# Patient Record
Sex: Female | Born: 1985 | Race: Black or African American | Hispanic: No | Marital: Single | Smoking: Never smoker
Health system: Southern US, Community
[De-identification: ages and names within clinical notes are randomized; demographics above are authoritative.]

## PROBLEM LIST (undated history)

## (undated) ENCOUNTER — Inpatient Hospital Stay (HOSPITAL_COMMUNITY): Payer: Self-pay

## (undated) DIAGNOSIS — E282 Polycystic ovarian syndrome: Secondary | ICD-10-CM

## (undated) DIAGNOSIS — R3915 Urgency of urination: Secondary | ICD-10-CM

## (undated) DIAGNOSIS — Z973 Presence of spectacles and contact lenses: Secondary | ICD-10-CM

## (undated) DIAGNOSIS — Z87442 Personal history of urinary calculi: Secondary | ICD-10-CM

## (undated) DIAGNOSIS — L0291 Cutaneous abscess, unspecified: Secondary | ICD-10-CM

## (undated) DIAGNOSIS — N200 Calculus of kidney: Secondary | ICD-10-CM

## (undated) HISTORY — PX: EXTRACORPOREAL SHOCK WAVE LITHOTRIPSY: SHX1557

## (undated) HISTORY — PX: BUNIONECTOMY: SHX129

---

## 2010-11-29 HISTORY — PX: WISDOM TOOTH EXTRACTION: SHX21

## 2014-04-10 ENCOUNTER — Emergency Department (HOSPITAL_BASED_OUTPATIENT_CLINIC_OR_DEPARTMENT_OTHER)
Admission: EM | Admit: 2014-04-10 | Discharge: 2014-04-11 | Disposition: A | Discharge status: Home / Self Care | Payer: No Typology Code available for payment source | Attending: Emergency Medicine | Admitting: Emergency Medicine

## 2014-04-10 ENCOUNTER — Encounter (HOSPITAL_BASED_OUTPATIENT_CLINIC_OR_DEPARTMENT_OTHER): Payer: Self-pay | Admitting: Emergency Medicine

## 2014-04-10 DIAGNOSIS — R1032 Left lower quadrant pain: Secondary | ICD-10-CM | POA: Insufficient documentation

## 2014-04-10 DIAGNOSIS — Z3202 Encounter for pregnancy test, result negative: Secondary | ICD-10-CM | POA: Insufficient documentation

## 2014-04-10 LAB — URINALYSIS, ROUTINE W REFLEX MICROSCOPIC
Bilirubin Urine: NEGATIVE
Glucose, UA: NEGATIVE mg/dL
Hgb urine dipstick: NEGATIVE
Ketones, ur: NEGATIVE mg/dL
Nitrite: NEGATIVE
Protein, ur: NEGATIVE mg/dL
Specific Gravity, Urine: 1.024 (ref 1.005–1.030)
UROBILINOGEN UA: 0.2 mg/dL (ref 0.0–1.0)
pH: 6 (ref 5.0–8.0)

## 2014-04-10 LAB — URINE MICROSCOPIC-ADD ON

## 2014-04-10 LAB — PREGNANCY, URINE: PREG TEST UR: NEGATIVE

## 2014-04-10 NOTE — ED Notes (Signed)
Pt was given a gown and asked to take everything off but her underware.  When I went back with warm blankets she still had not made any attempts to change into her gown. She said "I'm making progress."

## 2014-04-10 NOTE — ED Notes (Signed)
Pt states that she has had irregular periods in the past and was on birth control, but that was stopped 2 years ago and periods have been regular since then. However she did stated that she does take a birth control pill if she does not have a period for 2 months, but she is unaware of the name of pill.

## 2014-04-10 NOTE — ED Notes (Signed)
Pt reports 2 1/2 week history of abdominal cramps associated with nausea, no emesis, states that she does not have menstraul cramping so she knows it is not related to her period, pt stated that she could not take the pain any longer

## 2014-04-11 ENCOUNTER — Emergency Department (HOSPITAL_BASED_OUTPATIENT_CLINIC_OR_DEPARTMENT_OTHER): Payer: No Typology Code available for payment source

## 2014-04-11 LAB — CBC WITH DIFFERENTIAL/PLATELET
BASOS ABS: 0 10*3/uL (ref 0.0–0.1)
BASOS PCT: 0 % (ref 0–1)
EOS PCT: 1 % (ref 0–5)
Eosinophils Absolute: 0.1 10*3/uL (ref 0.0–0.7)
HEMATOCRIT: 39.5 % (ref 36.0–46.0)
HEMOGLOBIN: 12.8 g/dL (ref 12.0–15.0)
Lymphocytes Relative: 31 % (ref 12–46)
Lymphs Abs: 2.1 10*3/uL (ref 0.7–4.0)
MCH: 31 pg (ref 26.0–34.0)
MCHC: 32.4 g/dL (ref 30.0–36.0)
MCV: 95.6 fL (ref 78.0–100.0)
MONOS PCT: 8 % (ref 3–12)
Monocytes Absolute: 0.5 10*3/uL (ref 0.1–1.0)
Neutro Abs: 4.1 10*3/uL (ref 1.7–7.7)
Neutrophils Relative %: 60 % (ref 43–77)
Platelets: 222 10*3/uL (ref 150–400)
RBC: 4.13 MIL/uL (ref 3.87–5.11)
RDW: 12.3 % (ref 11.5–15.5)
WBC: 6.7 10*3/uL (ref 4.0–10.5)

## 2014-04-11 LAB — BASIC METABOLIC PANEL
BUN: 8 mg/dL (ref 6–23)
CALCIUM: 9.9 mg/dL (ref 8.4–10.5)
CO2: 27 mEq/L (ref 19–32)
Chloride: 104 mEq/L (ref 96–112)
Creatinine, Ser: 0.8 mg/dL (ref 0.50–1.10)
GFR calc non Af Amer: >90 mL/min (ref >90)
Glucose, Bld: 96 mg/dL (ref 70–99)
Potassium: 4 mEq/L (ref 3.7–5.3)
Sodium: 142 mEq/L (ref 137–147)

## 2014-04-11 LAB — HIV ANTIBODY (ROUTINE TESTING W REFLEX): HIV: NONREACTIVE

## 2014-04-11 LAB — WET PREP, GENITAL
Trich, Wet Prep: NONE SEEN
Yeast Wet Prep HPF POC: NONE SEEN

## 2014-04-11 LAB — LIPASE, BLOOD: LIPASE: 43 U/L (ref 11–59)

## 2014-04-11 LAB — GC/CHLAMYDIA PROBE AMP
CT Probe RNA: NEGATIVE
GC Probe RNA: NEGATIVE

## 2014-04-11 MED ORDER — SODIUM CHLORIDE 0.9 % IV SOLN
INTRAVENOUS | Status: DC
  Administered 2014-04-11: 01:00:00 via INTRAVENOUS

## 2014-04-11 MED ORDER — IOHEXOL 300 MG/ML  SOLN
50.0000 mL | Freq: Once | INTRAMUSCULAR | Status: AC | PRN
  Administered 2014-04-11: 50 mL via ORAL

## 2014-04-11 MED ORDER — IOHEXOL 300 MG/ML  SOLN
100.0000 mL | Freq: Once | INTRAMUSCULAR | Status: AC | PRN
  Administered 2014-04-11: 100 mL via INTRAVENOUS

## 2014-04-11 NOTE — ED Notes (Signed)
MD at bedside. 

## 2014-04-11 NOTE — ED Provider Notes (Signed)
CSN: [id number]     Arrival date & time 04/10/14  2308 History   First MD Initiated Contact with Patient 04/11/14 0024     Chief Complaint  Patient presents with  . Abdominal Cramping     (Consider location/radiation/quality/duration/timing/severity/associated sxs/prior Treatment) HPI This is a 28 year old female with a 2 and half week history of abdominal pain. The pain is located in the left lower quadrant it radiates to the suprapubic and left upper quadrant regions. The pain is described as crampy. It is "real bad" at its worst. It has not been associated with fever, chills, nausea, vomiting, diarrhea, constipation, vaginal bleeding, vaginal discharge, dysuria or hematuria. It is worse with movement or palpation.  History reviewed. No pertinent past medical history. History reviewed. No pertinent past surgical history. History reviewed. No pertinent family history. History  Substance Use Topics  . Smoking status: Never Smoker   . Smokeless tobacco: Not on file  . Alcohol Use: Yes     Comment: occassionally   OB History   Grav Para Term Preterm Abortions TAB SAB Ect Mult Living                 Review of Systems  All other systems reviewed and are negative.   Allergies  Review of patient's allergies indicates no known allergies.  Home Medications   Prior to Admission medications   Medication Sig Start Date End Date Taking? Authorizing Provider  medroxyPROGESTERone (PROVERA) 10 MG tablet Take 10 mg by mouth daily. She only takes if she has had no period in 2 months, last dose was in october   Yes Historical Provider, MD   BP 120/61  Pulse 60  Temp(Src) 98.1 F (36.7 C) (Oral)  Resp 20  Ht 5\' 7"  (1.702 m)  Wt 245 lb (111.131 kg)  BMI 38.36 kg/m2  SpO2 98%  LMP 03/11/2014  Physical Exam General: Well-developed, well-nourished female in no acute distress; appearance consistent with age of record HENT: normocephalic; atraumatic Eyes: pupils equal, round and  reactive to light; extraocular muscles intact Neck: supple Heart: regular rate and rhythm Lungs: clear to auscultation bilaterally Abdomen: soft; nondistended; left lower quadrant tenderness with mild suprapubic and left upper quadrant tenderness; no masses or hepatosplenomegaly; bowel sounds present GU: No CVA tenderness; normal external genitalia; physiologic appearing vaginal discharge; no vaginal bleeding; no cervical motion tenderness; equivocal left adnexal tenderness Extremities: No deformity; full range of motion; pulses normal Neurologic: Awake, alert and oriented; motor function intact in all extremities and symmetric; no facial droop Skin: Warm and dry Psychiatric: Normal mood and affect    ED Course  Procedures (including critical care time)    MDM   Nursing notes and vitals signs, including pulse oximetry, reviewed.  Summary of this visit's results, reviewed by myself:  Labs:  Results for orders placed during the hospital encounter of 04/10/14 (from the past 24 hour(s))  URINALYSIS, ROUTINE W REFLEX MICROSCOPIC     Status: Abnormal   Collection Time    04/10/14 11:21 PM      Result Value Ref Range   Color, Urine YELLOW  YELLOW   APPearance CLOUDY (*) CLEAR   Specific Gravity, Urine 1.024  1.005 - 1.030   pH 6.0  5.0 - 8.0   Glucose, UA NEGATIVE  NEGATIVE mg/dL   Hgb urine dipstick NEGATIVE  NEGATIVE   Bilirubin Urine NEGATIVE  NEGATIVE   Ketones, ur NEGATIVE  NEGATIVE mg/dL   Protein, ur NEGATIVE  NEGATIVE mg/dL   Urobilinogen, UA  0.2  0.0 - 1.0 mg/dL   Nitrite NEGATIVE  NEGATIVE   Leukocytes, UA SMALL (*) NEGATIVE  PREGNANCY, URINE     Status: None   Collection Time    04/10/14 11:21 PM      Result Value Ref Range   Preg Test, Ur NEGATIVE  NEGATIVE  URINE MICROSCOPIC-ADD ON     Status: Abnormal   Collection Time    04/10/14 11:21 PM      Result Value Ref Range   Squamous Epithelial / LPF MANY (*) RARE   WBC, UA 3-6  <3 WBC/hpf   RBC / HPF 0-2  <3  RBC/hpf   Bacteria, UA FEW (*) RARE  WET PREP, GENITAL     Status: Abnormal   Collection Time    04/11/14 12:45 AM      Result Value Ref Range   Yeast Wet Prep HPF POC NONE SEEN  NONE SEEN   Trich, Wet Prep NONE SEEN  NONE SEEN   Clue Cells Wet Prep HPF POC FEW (*) NONE SEEN   WBC, Wet Prep HPF POC FEW (*) NONE SEEN  CBC WITH DIFFERENTIAL     Status: None   Collection Time    04/11/14  1:05 AM      Result Value Ref Range   WBC 6.7  4.0 - 10.5 K/uL   RBC 4.13  3.87 - 5.11 MIL/uL   Hemoglobin 12.8  12.0 - 15.0 g/dL   HCT 16.139.5  09.636.0 - 04.546.0 %   MCV 95.6  78.0 - 100.0 fL   MCH 31.0  26.0 - 34.0 pg   MCHC 32.4  30.0 - 36.0 g/dL   RDW 40.912.3  81.111.5 - 91.415.5 %   Platelets 222  150 - 400 K/uL   Neutrophils Relative % 60  43 - 77 %   Neutro Abs 4.1  1.7 - 7.7 K/uL   Lymphocytes Relative 31  12 - 46 %   Lymphs Abs 2.1  0.7 - 4.0 K/uL   Monocytes Relative 8  3 - 12 %   Monocytes Absolute 0.5  0.1 - 1.0 K/uL   Eosinophils Relative 1  0 - 5 %   Eosinophils Absolute 0.1  0.0 - 0.7 K/uL   Basophils Relative 0  0 - 1 %   Basophils Absolute 0.0  0.0 - 0.1 K/uL  BASIC METABOLIC PANEL     Status: None   Collection Time    04/11/14  1:05 AM      Result Value Ref Range   Sodium 142  137 - 147 mEq/L   Potassium 4.0  3.7 - 5.3 mEq/L   Chloride 104  96 - 112 mEq/L   CO2 27  19 - 32 mEq/L   Glucose, Bld 96  70 - 99 mg/dL   BUN 8  6 - 23 mg/dL   Creatinine, Ser 7.820.80  0.50 - 1.10 mg/dL   Calcium 9.9  8.4 - 95.610.5 mg/dL   GFR calc non Af Amer >90  >90 mL/min   GFR calc Af Amer >90  >90 mL/min  LIPASE, BLOOD     Status: None   Collection Time    04/11/14  1:05 AM      Result Value Ref Range   Lipase 43  11 - 59 U/L    Imaging Studies: Ct Abdomen Pelvis W Contrast  04/11/2014   CLINICAL DATA:  Left lower quadrant pain and nausea.  EXAM: CT ABDOMEN AND PELVIS WITH CONTRAST  TECHNIQUE:  Multidetector CT imaging of the abdomen and pelvis was performed using the standard protocol following bolus  administration of intravenous contrast.  CONTRAST:  50mL OMNIPAQUE IOHEXOL 300 MG/ML SOLN, OMNIPAQUE IOHEXOL 300 MG/ML SOLN  COMPARISON:  None.  FINDINGS: Lung bases are clear.  The liver, spleen, gallbladder, pancreas, adrenal glands, abdominal aorta, inferior vena cava, and retroperitoneal lymph nodes are unremarkable. Suggestion of tiny punctate calcifications centrally in the kidneys. No hydronephrosis. No ureterectasis or ureteral stones identified. The stomach, small bowel, and colon are unremarkable for degree of distention. No free air or free fluid in the abdomen.  Pelvis: Uterus and ovaries are not enlarged. Bladder wall is not thickened. Appendix is normal. No evidence of diverticulitis. No significant pelvic lymphadenopathy. No free or loculated pelvic fluid collections. No destructive bone lesions.  IMPRESSION: Tiny punctate calcifications in both kidneys without obstruction. No acute process demonstrated in the abdomen or pelvis.   Electronically Signed   By: Burman Nieves M.D.   On: 04/11/2014 02:28   3:47 AM Patient advised of unremarkable labs a CT results. Her symptoms may be related to irritable bowel syndrome. She was advised that narcotics are not indicated. She was advised to increase fiber in her diet. She has no primary care physician and we will give her referral.     Hanley Seamen, MD 04/11/14 [phone]

## 2014-11-29 NOTE — L&D Delivery Note (Signed)
Patient is 29 y.o. G1P1001 [id number] admitted SOL   Delivery Note At 6:28 PM a viable female was delivered via Vaginal, Spontaneous Delivery (Presentation: ;  ).  APGAR: 8, 9; weight 7 lb 1.9 oz (3229 g).   Placenta status: Intact, Spontaneous.  Cord:  with the following complications: None.   Anesthesia: Epidural  Episiotomy: None Lacerations: None Suture Repair: n/a Est. Blood Loss (mL): 250  Mom to postpartum.  Baby to Couplet care / Skin to Skin.  Demetri Goshert ROCIO 02/25/2015, 8:14 PM

## 2014-12-19 ENCOUNTER — Inpatient Hospital Stay (HOSPITAL_COMMUNITY)
Admission: AD | Admit: 2014-12-19 | Discharge: 2014-12-19 | Disposition: A | Discharge status: Home / Self Care | Payer: BLUE CROSS/BLUE SHIELD | Source: Ambulatory Visit | Attending: Obstetrics & Gynecology | Admitting: Obstetrics & Gynecology

## 2014-12-19 ENCOUNTER — Encounter (HOSPITAL_COMMUNITY): Payer: Self-pay | Admitting: *Deleted

## 2014-12-19 DIAGNOSIS — Z3A00 Weeks of gestation of pregnancy not specified: Secondary | ICD-10-CM | POA: Insufficient documentation

## 2014-12-19 DIAGNOSIS — O9989 Other specified diseases and conditions complicating pregnancy, childbirth and the puerperium: Secondary | ICD-10-CM | POA: Insufficient documentation

## 2014-12-19 DIAGNOSIS — R109 Unspecified abdominal pain: Secondary | ICD-10-CM

## 2014-12-19 DIAGNOSIS — O26899 Other specified pregnancy related conditions, unspecified trimester: Secondary | ICD-10-CM

## 2014-12-19 LAB — CBC
HEMATOCRIT: 33.2 % — AB (ref 36.0–46.0)
Hemoglobin: 11.4 g/dL — ABNORMAL LOW (ref 12.0–15.0)
MCH: 31.8 pg (ref 26.0–34.0)
MCHC: 34.3 g/dL (ref 30.0–36.0)
MCV: 92.5 fL (ref 78.0–100.0)
PLATELETS: 223 10*3/uL (ref 150–400)
RBC: 3.59 MIL/uL — AB (ref 3.87–5.11)
RDW: 12.5 % (ref 11.5–15.5)
WBC: 10.1 10*3/uL (ref 4.0–10.5)

## 2014-12-19 LAB — WET PREP, GENITAL
TRICH WET PREP: NONE SEEN
Yeast Wet Prep HPF POC: NONE SEEN

## 2014-12-19 LAB — RAPID URINE DRUG SCREEN, HOSP PERFORMED
AMPHETAMINES: NOT DETECTED
BARBITURATES: NOT DETECTED
Benzodiazepines: NOT DETECTED
COCAINE: NOT DETECTED
Opiates: NOT DETECTED
Tetrahydrocannabinol: NOT DETECTED

## 2014-12-19 LAB — URINALYSIS, ROUTINE W REFLEX MICROSCOPIC
Bilirubin Urine: NEGATIVE
Glucose, UA: NEGATIVE mg/dL
KETONES UR: NEGATIVE mg/dL
Nitrite: NEGATIVE
Protein, ur: NEGATIVE mg/dL
Specific Gravity, Urine: 1.01 (ref 1.005–1.030)
Urobilinogen, UA: 0.2 mg/dL (ref 0.0–1.0)
pH: 7 (ref 5.0–8.0)

## 2014-12-19 LAB — URINE MICROSCOPIC-ADD ON

## 2014-12-19 NOTE — MAU Provider Note (Signed)
  History     CSN: [id number]  Arrival date and time: 12/19/14 1921   None     Chief Complaint  Patient presents with  . Abdominal Pain   HPI  Mindy Davies is a 29 y.o. G1P0 at Unknown who presents today with abdominal pain. [facility] and found out she was pregnant. She is unsure of her LMP and she is not feeling fetal movement. Although she does report that that "felt something" a few weeks ago, but was not thinking that she was pregnant. She denies any VB or LOF.   No past medical history on file.  Past Surgical History  Procedure Laterality Date  . Wisdom tooth extraction    . Bunionectomy      Family History  Problem Relation Age of Onset  . Cancer Paternal Grandmother     History  Substance Use Topics  . Smoking status: Never Smoker   . Smokeless tobacco: Never Used  . Alcohol Use: Yes     Comment: occassionally    Allergies: No Known Allergies  No prescriptions prior to admission    ROS Physical Exam   Blood pressure 131/72, pulse 70, temperature 98.6 F (37 C), temperature source Oral, resp. rate 20, height 5\' 6"  (1.676 m), weight 120.203 kg (265 lb), last menstrual period 01/13/2014, SpO2 100 %.  Physical Exam  Nursing note and vitals reviewed. Constitutional: She is oriented to person, place, and time. She appears well-developed and well-nourished. No distress.  Cardiovascular: Normal rate.   Respiratory: Effort normal.  GI: Soft. There is no tenderness. There is no rebound.  Genitourinary:   Fundal height: 33cm  Cervix: closed/thick/high   Neurological: She is alert and oriented to person, place, and time.  Skin: Skin is warm and dry.  Psychiatric: She has a normal mood and affect.   FHT: 140, moderate with 15x15 accels, no decels Toco: no UCs  MAU Course  Procedures   Assessment and Plan   1. Abdominal pain affecting pregnancy, antepartum     Third trimester of pregnancy Abdominal pain DC home  NOB labs collected  today Will schedule outpatient US PTL precautions Fetal kick counts FU in clinic  12/24/14 @1 :00PM    Tawnya CrookHogan, Heather Donovan 12/19/2014, 9:01 PM

## 2014-12-19 NOTE — MAU Note (Signed)
Pt reports she has been having left sided pain x 4 weeks, was just seen at an urgent care and was told she was pregnant. Denies nausea, vomiting, diarrhea, or fever. Denies vaginal bleeding.

## 2014-12-19 NOTE — Discharge Instructions (Signed)
Third Trimester of Pregnancy The third trimester is from week 29 through week 42, months 7 through 9. The third trimester is a time when the fetus is growing rapidly. At the end of the ninth month, the fetus is about 20 inches in length and weighs 6-10 pounds.  BODY CHANGES Your body goes through many changes during pregnancy. The changes vary from woman to woman.   Your weight will continue to increase. You can expect to gain 25-35 pounds (11-16 kg) by the end of the pregnancy.  You may begin to get stretch marks on your hips, abdomen, and breasts.  You may urinate more often because the fetus is moving lower into your pelvis and pressing on your bladder.  You may develop or continue to have heartburn as a result of your pregnancy.  You may develop constipation because certain hormones are causing the muscles that push waste through your intestines to slow down.  You may develop hemorrhoids or swollen, bulging veins (varicose veins).  You may have pelvic pain because of the weight gain and pregnancy hormones relaxing your joints between the bones in your pelvis. Backaches may result from overexertion of the muscles supporting your posture.  You may have changes in your hair. These can include thickening of your hair, rapid growth, and changes in texture. Some women also have hair loss during or after pregnancy, or hair that feels dry or thin. Your hair will most likely return to normal after your baby is born.  Your breasts will continue to grow and be tender. A yellow discharge may leak from your breasts called colostrum.  Your belly button may stick out.  You may feel short of breath because of your expanding uterus.  You may notice the fetus "dropping," or moving lower in your abdomen.  You may have a bloody mucus discharge. This usually occurs a few days to a week before labor begins.  Your cervix becomes thin and soft (effaced) near your due date. WHAT TO EXPECT AT YOUR PRENATAL  EXAMS  You will have prenatal exams every 2 weeks until week 36. Then, you will have weekly prenatal exams. During a routine prenatal visit:  You will be weighed to make sure you and the fetus are growing normally.  Your blood pressure is taken.  Your abdomen will be measured to track your baby's growth.  The fetal heartbeat will be listened to.  Any test results from the previous visit will be discussed.  You may have a cervical check near your due date to see if you have effaced. At around 36 weeks, your caregiver will check your cervix. At the same time, your caregiver will also perform a test on the secretions of the vaginal tissue. This test is to determine if a type of bacteria, Group B streptococcus, is present. Your caregiver will explain this further. Your caregiver may ask you:  What your birth plan is.  How you are feeling.  If you are feeling the baby move.  If you have had any abnormal symptoms, such as leaking fluid, bleeding, severe headaches, or abdominal cramping.  If you have any questions. Other tests or screenings that may be performed during your third trimester include:  Blood tests that check for low iron levels (anemia).  Fetal testing to check the health, activity level, and growth of the fetus. Testing is done if you have certain medical conditions or if there are problems during the pregnancy. FALSE LABOR You may feel small, irregular contractions that   eventually go away. These are called Braxton Hicks contractions, or false labor. Contractions may last for hours, days, or even weeks before true labor sets in. If contractions come at regular intervals, intensify, or become painful, it is best to be seen by your caregiver.  SIGNS OF LABOR   Menstrual-like cramps.  Contractions that are 5 minutes apart or less.  Contractions that start on the top of the uterus and spread down to the lower abdomen and back.  A sense of increased pelvic pressure or back  pain.  A watery or bloody mucus discharge that comes from the vagina. If you have any of these signs before the 37th week of pregnancy, call your caregiver right away. You need to go to the hospital to get checked immediately. HOME CARE INSTRUCTIONS   Avoid all smoking, herbs, alcohol, and unprescribed drugs. These chemicals affect the formation and growth of the baby.  Follow your caregiver's instructions regarding medicine use. There are medicines that are either safe or unsafe to take during pregnancy.  Exercise only as directed by your caregiver. Experiencing uterine cramps is a good sign to stop exercising.  Continue to eat regular, healthy meals.  Wear a good support bra for breast tenderness.  Do not use hot tubs, steam rooms, or saunas.  Wear your seat belt at all times when driving.  Avoid raw meat, uncooked cheese, cat litter boxes, and soil used by cats. These carry germs that can cause birth defects in the baby.  Take your prenatal vitamins.  Try taking a stool softener (if your caregiver approves) if you develop constipation. Eat more high-fiber foods, such as fresh vegetables or fruit and whole grains. Drink plenty of fluids to keep your urine clear or pale yellow.  Take warm sitz baths to soothe any pain or discomfort caused by hemorrhoids. Use hemorrhoid cream if your caregiver approves.  If you develop varicose veins, wear support hose. Elevate your feet for 15 minutes, 3-4 times a day. Limit salt in your diet.  Avoid heavy lifting, wear low heal shoes, and practice good posture.  Rest a lot with your legs elevated if you have leg cramps or low back pain.  Visit your dentist if you have not gone during your pregnancy. Use a soft toothbrush to brush your teeth and be gentle when you floss.  A sexual relationship may be continued unless your caregiver directs you otherwise.  Do not travel far distances unless it is absolutely necessary and only with the approval  of your caregiver.  Take prenatal classes to understand, practice, and ask questions about the labor and delivery.  Make a trial run to the hospital.  Pack your hospital bag.  Prepare the baby's nursery.  Continue to go to all your prenatal visits as directed by your caregiver. SEEK MEDICAL CARE IF:  You are unsure if you are in labor or if your water has broken.  You have dizziness.  You have mild pelvic cramps, pelvic pressure, or nagging pain in your abdominal area.  You have persistent nausea, vomiting, or diarrhea.  You have a bad smelling vaginal discharge.  You have pain with urination. SEEK IMMEDIATE MEDICAL CARE IF:   You have a fever.  You are leaking fluid from your vagina.  You have spotting or bleeding from your vagina.  You have severe abdominal cramping or pain.  You have rapid weight loss or gain.  You have shortness of breath with chest pain.  You notice sudden or extreme swelling   of your face, hands, ankles, feet, or legs.  You have not felt your baby move in over an hour.  You have severe headaches that do not go away with medicine.  You have vision changes. Document Released: 11/09/2001 Document Revised: 11/20/2013 Document Reviewed: 01/16/2013 [employer] Patient Information 2015 [employer]. This information is not intended to replace advice given to you by your health care provider. Make sure you discuss any questions you have with your health care provider.  

## 2014-12-20 LAB — ABO/RH: ABO/RH(D): AB POS

## 2014-12-20 LAB — HEPATITIS B SURFACE ANTIGEN: HEP B S AG: NEGATIVE

## 2014-12-20 LAB — GC/CHLAMYDIA PROBE AMP (~~LOC~~) NOT AT ARMC
CHLAMYDIA, DNA PROBE: NEGATIVE
NEISSERIA GONORRHEA: NEGATIVE

## 2014-12-21 LAB — RUBELLA SCREEN: RUBELLA: 1.65 {index} (ref >0.99)

## 2014-12-21 LAB — CULTURE, OB URINE: Colony Count: 95000

## 2014-12-21 LAB — HIV ANTIBODY (ROUTINE TESTING W REFLEX)
HIV 1/O/2 Abs-Index Value: <1 (ref <1.00)
HIV-1/HIV-2 Ab: NONREACTIVE

## 2014-12-23 ENCOUNTER — Telehealth: Payer: Self-pay | Admitting: *Deleted

## 2014-12-23 NOTE — Telephone Encounter (Signed)
Pt left message stating that she just found out that she was pregnant 4 days ago. She states that she is between 7-8 months along. She is wanting to schedule her ultrasound. Per chart review, pt was seen @ [facility] on 1/21 and notes by Thressa ShellerHeather Hogan, CNM state that an ultrasound was ordered and pt should begin prenatal care in our office on 1/26 @ 1300. I called pt back and left message that I have scheduled her US for 1/29 @ 0815. She also needs to begin prenatal care and this appt has been scheduled for tomorrow @ 1100. Please disregard any other time that you have been given previously for this prenatal care appt. She should plan to be in our clinic for approximately 1.5 hrs for this first appt.

## 2014-12-24 ENCOUNTER — Encounter: Payer: Self-pay | Admitting: Physician Assistant

## 2014-12-24 ENCOUNTER — Ambulatory Visit (INDEPENDENT_AMBULATORY_CARE_PROVIDER_SITE_OTHER): Payer: BLUE CROSS/BLUE SHIELD | Admitting: Physician Assistant

## 2014-12-24 VITALS — BP 117/72 | HR 85 | Temp 98.0°F | Wt 258.7 lb

## 2014-12-24 DIAGNOSIS — Z23 Encounter for immunization: Secondary | ICD-10-CM

## 2014-12-24 DIAGNOSIS — Z3403 Encounter for supervision of normal first pregnancy, third trimester: Secondary | ICD-10-CM

## 2014-12-24 DIAGNOSIS — O0933 Supervision of pregnancy with insufficient antenatal care, third trimester: Secondary | ICD-10-CM

## 2014-12-24 DIAGNOSIS — O0930 Supervision of pregnancy with insufficient antenatal care, unspecified trimester: Secondary | ICD-10-CM

## 2014-12-24 LAB — POCT URINALYSIS DIP (DEVICE)
Bilirubin Urine: NEGATIVE
GLUCOSE, UA: NEGATIVE mg/dL
KETONES UR: NEGATIVE mg/dL
NITRITE: NEGATIVE
PH: 7 (ref 5.0–8.0)
PROTEIN: NEGATIVE mg/dL
Specific Gravity, Urine: 1.015 (ref 1.005–1.030)
Urobilinogen, UA: 0.2 mg/dL (ref 0.0–1.0)

## 2014-12-24 MED ORDER — TETANUS-DIPHTH-ACELL PERTUSSIS 5-2.5-18.5 LF-MCG/0.5 IM SUSP
0.5000 mL | Freq: Once | INTRAMUSCULAR | Status: AC
  Administered 2014-12-24: 0.5 mL via INTRAMUSCULAR

## 2014-12-24 NOTE — Progress Notes (Signed)
?  GA as LMP is uncertain and no u/s yet.    Subjective:    Mindy Davies is a G1P0000 Unknown being seen today for her first obstetrical visit.  Patient does intend to breast feed. Pregnancy history fully reviewed.  Patient reports no complaints.  Filed Vitals:   12/24/14 1123  BP: 117/72  Pulse: 85  Temp: 98 F (36.7 C)  Weight: 258 lb 11.2 oz (117.346 kg)    HISTORY: OB History  Gravida Para Term Preterm AB SAB TAB Ectopic Multiple Living  1 0 0 0 0 0 0 0 0 0     # Outcome Date GA Lbr Len/2nd Weight Sex Delivery Anes PTL Lv  1 Current              Past Medical History  Diagnosis Date  . Medical history non-contributory    Past Surgical History  Procedure Laterality Date  . Wisdom tooth extraction    . Bunionectomy     Family History  Problem Relation Age of Onset  . Cancer Paternal Grandmother      Exam    Uterus:   gravid to 33 weeks  Pelvic Exam:    Perineum: No Hemorrhoids, Normal Perineum   Vulva: normal   Vagina:  normal mucosa, normal discharge       Cervix: no cervical motion tenderness   Adnexa: normal adnexa and no mass, fullness, tenderness   Bony Pelvis: average  System:     Skin: normal coloration and turgor, no rashes    Neurologic: oriented, normal mood, grossly non-focal   Extremities: normal strength, tone, and muscle mass   HEENT extra ocular movement intact and neck supple with midline trachea   Mouth/Teeth mucous membranes moist, pharynx normal without lesions and dental hygiene good   Neck supple   Cardiovascular: regular rate and rhythm   Respiratory:  appears well, vitals normal, no respiratory distress, acyanotic, normal RR, ear and throat exam is normal, neck free of mass or lymphadenopathy, chest clear, no wheezing, crepitations, rhonchi, normal symmetric air entry   Abdomen: soft, non-tender; bowel sounds normal; no masses,  no organomegaly   Urinary: urethral meatus normal      Assessment:    Pregnancy:  G1P0000 Patient Active Problem List   Diagnosis Date Noted  . Late prenatal care affecting pregnancy in third trimester 12/24/2014        Plan:     Initial labs drawn. Prenatal vitamins. Problem list reviewed and updated. Genetic Screening discussed.  Too Late   Ultrasound discussed; fetal survey: ordered.  Follow up in 1 weeks. 50% of 30 min visit spent on counseling and coordination of care.     Bertram Denvereague Clark, Braylan Faul E 12/24/2014

## 2014-12-24 NOTE — Patient Instructions (Signed)
Preterm Labor Information Preterm labor is when labor starts at less than 37 weeks of pregnancy. The normal length of a pregnancy is 39 to 41 weeks. CAUSES Often, there is no identifiable underlying cause as to why a woman goes into preterm labor. One of the most common known causes of preterm labor is infection. Infections of the uterus, cervix, vagina, amniotic sac, bladder, kidney, or even the lungs (pneumonia) can cause labor to start. Other suspected causes of preterm labor include:   Urogenital infections, such as yeast infections and bacterial vaginosis.   Uterine abnormalities (uterine shape, uterine septum, fibroids, or bleeding from the placenta).   A cervix that has been operated on (it may fail to stay closed).   Malformations in the fetus.   Multiple gestations (twins, triplets, and so on).   Breakage of the amniotic sac.  RISK FACTORS  Having a previous history of preterm labor.   Having premature rupture of membranes (PROM).   Having a placenta that covers the opening of the cervix (placenta previa).   Having a placenta that separates from the uterus (placental abruption).   Having a cervix that is too weak to hold the fetus in the uterus (incompetent cervix).   Having too much fluid in the amniotic sac (polyhydramnios).   Taking illegal drugs or smoking while pregnant.   Not gaining enough weight while pregnant.   Being younger than 18 and older than 28 years old.   Having a low socioeconomic status.   Being African American. SYMPTOMS Signs and symptoms of preterm labor include:   Menstrual-like cramps, abdominal pain, or back pain.  Uterine contractions that are regular, as frequent as six in an hour, regardless of their intensity (may be mild or painful).  Contractions that start on the top of the uterus and spread down to the lower abdomen and back.   A sense of increased pelvic pressure.   A watery or bloody mucus discharge that  comes from the vagina.  TREATMENT Depending on the length of the pregnancy and other circumstances, your health care provider may suggest bed rest. If necessary, there are medicines that can be given to stop contractions and to mature the fetal lungs. If labor happens before 34 weeks of pregnancy, a prolonged hospital stay may be recommended. Treatment depends on the condition of both you and the fetus.  WHAT SHOULD YOU DO IF YOU THINK YOU ARE IN PRETERM LABOR? Call your health care provider right away. You will need to go to the hospital to get checked immediately. HOW CAN YOU PREVENT PRETERM LABOR IN FUTURE PREGNANCIES? You should:   Stop smoking if you smoke.  Maintain healthy weight gain and avoid chemicals and drugs that are not necessary.  Be watchful for any type of infection.  Inform your health care provider if you have a known history of preterm labor. Document Released: 02/05/2004 Document Revised: 07/18/2013 Document Reviewed: 12/18/2012 [employer] Patient Information 2015 [employer]. This information is not intended to replace advice given to you by your health care provider. Make sure you discuss any questions you have with your health care provider.  Third Trimester of Pregnancy The third trimester is from week 29 through week 42, months 7 through 9. The third trimester is a time when the fetus is growing rapidly. At the end of the ninth month, the fetus is about 20 inches in length and weighs 6-10 pounds.  BODY CHANGES Your body goes through many changes during pregnancy. The changes vary from   woman to woman.   Your weight will continue to increase. You can expect to gain 25-35 pounds (11-16 kg) by the end of the pregnancy.  You may begin to get stretch marks on your hips, abdomen, and breasts.  You may urinate more often because the fetus is moving lower into your pelvis and pressing on your bladder.  You may develop or continue to have heartburn as a result of your  pregnancy.  You may develop constipation because certain hormones are causing the muscles that push waste through your intestines to slow down.  You may develop hemorrhoids or swollen, bulging veins (varicose veins).  You may have pelvic pain because of the weight gain and pregnancy hormones relaxing your joints between the bones in your pelvis. Backaches may result from overexertion of the muscles supporting your posture.  You may have changes in your hair. These can include thickening of your hair, rapid growth, and changes in texture. Some women also have hair loss during or after pregnancy, or hair that feels dry or thin. Your hair will most likely return to normal after your baby is born.  Your breasts will continue to grow and be tender. A yellow discharge may leak from your breasts called colostrum.  Your belly button may stick out.  You may feel short of breath because of your expanding uterus.  You may notice the fetus "dropping," or moving lower in your abdomen.  You may have a bloody mucus discharge. This usually occurs a few days to a week before labor begins.  Your cervix becomes thin and soft (effaced) near your due date. WHAT TO EXPECT AT YOUR PRENATAL EXAMS  You will have prenatal exams every 2 weeks until week 36. Then, you will have weekly prenatal exams. During a routine prenatal visit:  You will be weighed to make sure you and the fetus are growing normally.  Your blood pressure is taken.  Your abdomen will be measured to track your baby's growth.  The fetal heartbeat will be listened to.  Any test results from the previous visit will be discussed.  You may have a cervical check near your due date to see if you have effaced. At around 36 weeks, your caregiver will check your cervix. At the same time, your caregiver will also perform a test on the secretions of the vaginal tissue. This test is to determine if a type of bacteria, Group B streptococcus, is present.  Your caregiver will explain this further. Your caregiver may ask you:  What your birth plan is.  How you are feeling.  If you are feeling the baby move.  If you have had any abnormal symptoms, such as leaking fluid, bleeding, severe headaches, or abdominal cramping.  If you have any questions. Other tests or screenings that may be performed during your third trimester include:  Blood tests that check for low iron levels (anemia).  Fetal testing to check the health, activity level, and growth of the fetus. Testing is done if you have certain medical conditions or if there are problems during the pregnancy. FALSE LABOR You may feel small, irregular contractions that eventually go away. These are called Braxton Hicks contractions, or false labor. Contractions may last for hours, days, or even weeks before true labor sets in. If contractions come at regular intervals, intensify, or become painful, it is best to be seen by your caregiver.  SIGNS OF LABOR   Menstrual-like cramps.  Contractions that are 5 minutes apart or less.    Contractions that start on the top of the uterus and spread down to the lower abdomen and back.  A sense of increased pelvic pressure or back pain.  A watery or bloody mucus discharge that comes from the vagina. If you have any of these signs before the 37th week of pregnancy, call your caregiver right away. You need to go to the hospital to get checked immediately. HOME CARE INSTRUCTIONS   Avoid all smoking, herbs, alcohol, and unprescribed drugs. These chemicals affect the formation and growth of the baby.  Follow your caregiver's instructions regarding medicine use. There are medicines that are either safe or unsafe to take during pregnancy.  Exercise only as directed by your caregiver. Experiencing uterine cramps is a good sign to stop exercising.  Continue to eat regular, healthy meals.  Wear a good support bra for breast tenderness.  Do not use hot  tubs, steam rooms, or saunas.  Wear your seat belt at all times when driving.  Avoid raw meat, uncooked cheese, cat litter boxes, and soil used by cats. These carry germs that can cause birth defects in the baby.  Take your prenatal vitamins.  Try taking a stool softener (if your caregiver approves) if you develop constipation. Eat more high-fiber foods, such as fresh vegetables or fruit and whole grains. Drink plenty of fluids to keep your urine clear or pale yellow.  Take warm sitz baths to soothe any pain or discomfort caused by hemorrhoids. Use hemorrhoid cream if your caregiver approves.  If you develop varicose veins, wear support hose. Elevate your feet for 15 minutes, 3-4 times a day. Limit salt in your diet.  Avoid heavy lifting, wear low heal shoes, and practice good posture.  Rest a lot with your legs elevated if you have leg cramps or low back pain.  Visit your dentist if you have not gone during your pregnancy. Use a soft toothbrush to brush your teeth and be gentle when you floss.  A sexual relationship may be continued unless your caregiver directs you otherwise.  Do not travel far distances unless it is absolutely necessary and only with the approval of your caregiver.  Take prenatal classes to understand, practice, and ask questions about the labor and delivery.  Make a trial run to the hospital.  Pack your hospital bag.  Prepare the baby's nursery.  Continue to go to all your prenatal visits as directed by your caregiver. SEEK MEDICAL CARE IF:  You are unsure if you are in labor or if your water has broken.  You have dizziness.  You have mild pelvic cramps, pelvic pressure, or nagging pain in your abdominal area.  You have persistent nausea, vomiting, or diarrhea.  You have a bad smelling vaginal discharge.  You have pain with urination. SEEK IMMEDIATE MEDICAL CARE IF:   You have a fever.  You are leaking fluid from your vagina.  You have spotting  or bleeding from your vagina.  You have severe abdominal cramping or pain.  You have rapid weight loss or gain.  You have shortness of breath with chest pain.  You notice sudden or extreme swelling of your face, hands, ankles, feet, or legs.  You have not felt your baby move in over an hour.  You have severe headaches that do not go away with medicine.  You have vision changes. Document Released: 11/09/2001 Document Revised: 11/20/2013 Document Reviewed: 01/16/2013 [employer] Patient Information 2015 [employer]. This information is not intended to replace advice given to you   by your health care provider. Make sure you discuss any questions you have with your health care provider.  

## 2014-12-25 LAB — CYTOLOGY - PAP

## 2014-12-25 LAB — ANTIBODY SCREEN: Antibody Screen: NEGATIVE

## 2014-12-25 LAB — GLUCOSE TOLERANCE, 1 HOUR (50G) W/O FASTING: GLUCOSE 1 HOUR GTT: 76 mg/dL (ref 70–140)

## 2014-12-25 LAB — RPR

## 2014-12-26 LAB — HEMOGLOBINOPATHY EVALUATION
HEMOGLOBIN OTHER: 0 %
Hgb A2 Quant: 3 % (ref 2.2–3.2)
Hgb A: 97 % (ref 96.8–97.8)
Hgb F Quant: 0 % (ref 0.0–2.0)
Hgb S Quant: 0 %

## 2014-12-27 ENCOUNTER — Other Ambulatory Visit (HOSPITAL_COMMUNITY): Payer: Self-pay | Admitting: Advanced Practice Midwife

## 2014-12-27 ENCOUNTER — Ambulatory Visit (HOSPITAL_COMMUNITY)
Admission: RE | Admit: 2014-12-27 | Discharge: 2014-12-27 | Disposition: A | Discharge status: Home / Self Care | Payer: BLUE CROSS/BLUE SHIELD | Source: Ambulatory Visit | Attending: Advanced Practice Midwife | Admitting: Advanced Practice Midwife

## 2014-12-27 DIAGNOSIS — O26899 Other specified pregnancy related conditions, unspecified trimester: Secondary | ICD-10-CM

## 2014-12-27 DIAGNOSIS — O403XX0 Polyhydramnios, third trimester, not applicable or unspecified: Secondary | ICD-10-CM | POA: Diagnosis not present

## 2014-12-27 DIAGNOSIS — R109 Unspecified abdominal pain: Principal | ICD-10-CM

## 2014-12-27 DIAGNOSIS — O0933 Supervision of pregnancy with insufficient antenatal care, third trimester: Secondary | ICD-10-CM | POA: Insufficient documentation

## 2014-12-27 DIAGNOSIS — Z3A31 31 weeks gestation of pregnancy: Secondary | ICD-10-CM | POA: Insufficient documentation

## 2014-12-27 DIAGNOSIS — Z3689 Encounter for other specified antenatal screening: Secondary | ICD-10-CM | POA: Insufficient documentation

## 2014-12-27 DIAGNOSIS — O9989 Other specified diseases and conditions complicating pregnancy, childbirth and the puerperium: Secondary | ICD-10-CM | POA: Diagnosis not present

## 2015-01-01 ENCOUNTER — Encounter: Payer: Self-pay | Admitting: Advanced Practice Midwife

## 2015-01-01 ENCOUNTER — Ambulatory Visit (INDEPENDENT_AMBULATORY_CARE_PROVIDER_SITE_OTHER): Payer: BLUE CROSS/BLUE SHIELD | Admitting: Advanced Practice Midwife

## 2015-01-01 VITALS — BP 114/53 | HR 67 | Wt 264.4 lb

## 2015-01-01 DIAGNOSIS — O0933 Supervision of pregnancy with insufficient antenatal care, third trimester: Secondary | ICD-10-CM

## 2015-01-01 DIAGNOSIS — O403XX1 Polyhydramnios, third trimester, fetus 1: Secondary | ICD-10-CM

## 2015-01-01 DIAGNOSIS — Z3403 Encounter for supervision of normal first pregnancy, third trimester: Secondary | ICD-10-CM

## 2015-01-01 LAB — US OB FOLLOW UP

## 2015-01-01 LAB — POCT URINALYSIS DIP (DEVICE)
Bilirubin Urine: NEGATIVE
GLUCOSE, UA: NEGATIVE mg/dL
KETONES UR: NEGATIVE mg/dL
NITRITE: NEGATIVE
Protein, ur: NEGATIVE mg/dL
SPECIFIC GRAVITY, URINE: 1.015 (ref 1.005–1.030)
UROBILINOGEN UA: 0.2 mg/dL (ref 0.0–1.0)
pH: 7 (ref 5.0–8.0)

## 2015-01-01 NOTE — Progress Notes (Signed)
Doing well.  Good fetal movement, denies vaginal bleeding, LOF, regular contractions.  Discussed how practice works, CNMs and MDs, anticipatory guidance about visits, etc.  AFI retested today per recommendation on previous U/S.  AFI 21.5 today.  Consult Dr Claudean SeveranceWhitecar. Repeat U/S scheduled to complete anatomy 4 weeks from previous U/S. Routine prenatal care.

## 2015-01-03 ENCOUNTER — Ambulatory Visit (INDEPENDENT_AMBULATORY_CARE_PROVIDER_SITE_OTHER): Payer: BLUE CROSS/BLUE SHIELD | Admitting: *Deleted

## 2015-01-03 VITALS — BP 118/64 | HR 73

## 2015-01-03 DIAGNOSIS — O403XX0 Polyhydramnios, third trimester, not applicable or unspecified: Secondary | ICD-10-CM

## 2015-01-03 DIAGNOSIS — O403XX1 Polyhydramnios, third trimester, fetus 1: Secondary | ICD-10-CM

## 2015-01-04 LAB — CULTURE, OB URINE: Colony Count: 15000

## 2015-01-07 ENCOUNTER — Ambulatory Visit (INDEPENDENT_AMBULATORY_CARE_PROVIDER_SITE_OTHER): Payer: BLUE CROSS/BLUE SHIELD | Admitting: *Deleted

## 2015-01-07 VITALS — BP 119/65 | HR 73

## 2015-01-07 DIAGNOSIS — O403XX1 Polyhydramnios, third trimester, fetus 1: Secondary | ICD-10-CM

## 2015-01-07 DIAGNOSIS — O403XX0 Polyhydramnios, third trimester, not applicable or unspecified: Secondary | ICD-10-CM | POA: Diagnosis not present

## 2015-01-07 LAB — US OB FOLLOW UP

## 2015-01-07 NOTE — Progress Notes (Signed)
NST reactive.

## 2015-01-09 ENCOUNTER — Encounter: Payer: Self-pay | Admitting: Obstetrics and Gynecology

## 2015-01-09 ENCOUNTER — Ambulatory Visit (INDEPENDENT_AMBULATORY_CARE_PROVIDER_SITE_OTHER): Payer: BLUE CROSS/BLUE SHIELD | Admitting: Obstetrics and Gynecology

## 2015-01-09 ENCOUNTER — Encounter: Payer: Self-pay | Admitting: *Deleted

## 2015-01-09 ENCOUNTER — Other Ambulatory Visit: Payer: BLUE CROSS/BLUE SHIELD

## 2015-01-09 VITALS — BP 123/76 | HR 95 | Wt 269.2 lb

## 2015-01-09 DIAGNOSIS — O403XX1 Polyhydramnios, third trimester, fetus 1: Secondary | ICD-10-CM

## 2015-01-09 LAB — US OB FOLLOW UP

## 2015-01-09 LAB — POCT URINALYSIS DIP (DEVICE)
Bilirubin Urine: NEGATIVE
Glucose, UA: NEGATIVE mg/dL
KETONES UR: NEGATIVE mg/dL
Nitrite: NEGATIVE
PH: 7 (ref 5.0–8.0)
Protein, ur: NEGATIVE mg/dL
Specific Gravity, Urine: 1.015 (ref 1.005–1.030)
UROBILINOGEN UA: 0.2 mg/dL (ref 0.0–1.0)

## 2015-01-09 NOTE — Addendum Note (Signed)
Addended by: Jill SideAY, DIANE L on: 01/09/2015 09:38 AM   Modules accepted: Orders

## 2015-01-09 NOTE — Progress Notes (Signed)
FMLA completed/copied/pt will fax to her employer.

## 2015-01-09 NOTE — Progress Notes (Signed)
Patient is doing well without complaints. FM/PTL precautions reviewed. F/U ultrasound scheduled for end of February NST reviewed and reactive

## 2015-01-10 ENCOUNTER — Inpatient Hospital Stay (HOSPITAL_COMMUNITY)
Admission: AD | Admit: 2015-01-10 | Discharge: 2015-01-10 | Disposition: A | Discharge status: Home / Self Care | Payer: BLUE CROSS/BLUE SHIELD | Source: Ambulatory Visit | Attending: Family Medicine | Admitting: Family Medicine

## 2015-01-10 ENCOUNTER — Encounter (HOSPITAL_COMMUNITY): Payer: Self-pay | Admitting: *Deleted

## 2015-01-10 DIAGNOSIS — K219 Gastro-esophageal reflux disease without esophagitis: Secondary | ICD-10-CM | POA: Diagnosis not present

## 2015-01-10 DIAGNOSIS — O23593 Infection of other part of genital tract in pregnancy, third trimester: Secondary | ICD-10-CM | POA: Diagnosis not present

## 2015-01-10 DIAGNOSIS — N760 Acute vaginitis: Secondary | ICD-10-CM | POA: Diagnosis not present

## 2015-01-10 DIAGNOSIS — Z3A34 34 weeks gestation of pregnancy: Secondary | ICD-10-CM | POA: Diagnosis not present

## 2015-01-10 DIAGNOSIS — N949 Unspecified condition associated with female genital organs and menstrual cycle: Secondary | ICD-10-CM | POA: Insufficient documentation

## 2015-01-10 DIAGNOSIS — B9689 Other specified bacterial agents as the cause of diseases classified elsewhere: Secondary | ICD-10-CM

## 2015-01-10 DIAGNOSIS — O2343 Unspecified infection of urinary tract in pregnancy, third trimester: Secondary | ICD-10-CM | POA: Insufficient documentation

## 2015-01-10 DIAGNOSIS — Z3A33 33 weeks gestation of pregnancy: Secondary | ICD-10-CM | POA: Insufficient documentation

## 2015-01-10 DIAGNOSIS — O4703 False labor before 37 completed weeks of gestation, third trimester: Secondary | ICD-10-CM

## 2015-01-10 DIAGNOSIS — O99613 Diseases of the digestive system complicating pregnancy, third trimester: Secondary | ICD-10-CM | POA: Insufficient documentation

## 2015-01-10 LAB — URINALYSIS, ROUTINE W REFLEX MICROSCOPIC
BILIRUBIN URINE: NEGATIVE
Glucose, UA: NEGATIVE mg/dL
Ketones, ur: NEGATIVE mg/dL
Nitrite: NEGATIVE
Protein, ur: NEGATIVE mg/dL
Specific Gravity, Urine: 1.01 (ref 1.005–1.030)
UROBILINOGEN UA: 0.2 mg/dL (ref 0.0–1.0)
pH: 6.5 (ref 5.0–8.0)

## 2015-01-10 LAB — WET PREP, GENITAL
Trich, Wet Prep: NONE SEEN
Yeast Wet Prep HPF POC: NONE SEEN

## 2015-01-10 LAB — COMPREHENSIVE METABOLIC PANEL
ALBUMIN: 2.8 g/dL — AB (ref 3.5–5.2)
ALT: 12 U/L (ref 0–35)
AST: 17 U/L (ref 0–37)
Alkaline Phosphatase: 101 U/L (ref 39–117)
Anion gap: 9 (ref 5–15)
BILIRUBIN TOTAL: 0.4 mg/dL (ref 0.3–1.2)
BUN: 14 mg/dL (ref 6–23)
CHLORIDE: 101 mmol/L (ref 96–112)
CO2: 26 mmol/L (ref 19–32)
CREATININE: 0.82 mg/dL (ref 0.50–1.10)
Calcium: 11.9 mg/dL — ABNORMAL HIGH (ref 8.4–10.5)
GFR calc Af Amer: >90 mL/min (ref >90)
Glucose, Bld: 87 mg/dL (ref 70–99)
POTASSIUM: 4.4 mmol/L (ref 3.5–5.1)
SODIUM: 136 mmol/L (ref 135–145)
TOTAL PROTEIN: 7.1 g/dL (ref 6.0–8.3)

## 2015-01-10 LAB — CBC
HEMATOCRIT: 30.4 % — AB (ref 36.0–46.0)
Hemoglobin: 10.5 g/dL — ABNORMAL LOW (ref 12.0–15.0)
MCH: 31.6 pg (ref 26.0–34.0)
MCHC: 34.5 g/dL (ref 30.0–36.0)
MCV: 91.6 fL (ref 78.0–100.0)
Platelets: 235 10*3/uL (ref 150–400)
RBC: 3.32 MIL/uL — ABNORMAL LOW (ref 3.87–5.11)
RDW: 12.4 % (ref 11.5–15.5)
WBC: 11.2 10*3/uL — ABNORMAL HIGH (ref 4.0–10.5)

## 2015-01-10 LAB — URINE MICROSCOPIC-ADD ON

## 2015-01-10 LAB — FETAL FIBRONECTIN: Fetal Fibronectin: POSITIVE — AB

## 2015-01-10 MED ORDER — METRONIDAZOLE 500 MG PO TABS: 500.0000 mg | ORAL_TABLET | Freq: Two times a day (BID) | ORAL | Status: DC

## 2015-01-10 MED ORDER — METRONIDAZOLE 500 MG PO TABS
500.0000 mg | ORAL_TABLET | Freq: Once | ORAL | Status: AC
  Administered 2015-01-10: 500 mg via ORAL
  Filled 2015-01-10: qty 1

## 2015-01-10 MED ORDER — GI COCKTAIL ~~LOC~~
30.0000 mL | Freq: Once | ORAL | Status: AC
  Administered 2015-01-10: 30 mL via ORAL
  Filled 2015-01-10: qty 30

## 2015-01-10 MED ORDER — FAMOTIDINE 20 MG PO TABS: 20.0000 mg | ORAL_TABLET | Freq: Two times a day (BID) | ORAL | Status: DC

## 2015-01-10 MED ORDER — NITROFURANTOIN MONOHYD MACRO 100 MG PO CAPS
100.0000 mg | ORAL_CAPSULE | Freq: Once | ORAL | Status: AC
  Administered 2015-01-10: 100 mg via ORAL
  Filled 2015-01-10: qty 1

## 2015-01-10 MED ORDER — BETAMETHASONE SOD PHOS & ACET 6 (3-3) MG/ML IJ SUSP
12.0000 mg | Freq: Once | INTRAMUSCULAR | Status: AC
  Administered 2015-01-10: 12 mg via INTRAMUSCULAR
  Filled 2015-01-10: qty 2

## 2015-01-10 MED ORDER — NITROFURANTOIN MONOHYD MACRO 100 MG PO CAPS: 100.0000 mg | ORAL_CAPSULE | Freq: Two times a day (BID) | ORAL | Status: DC

## 2015-01-10 NOTE — MAU Note (Signed)
Pains started early this morning.  No bleeding or leaking. Feels pressure when stands. Pain now 4-6 minute

## 2015-01-10 NOTE — MAU Note (Signed)
EMS arrival. Pt started having mid upper abd pain last night with some pelvic pressure. Pelvic pressure got worse this morning.

## 2015-01-10 NOTE — Discharge Instructions (Signed)
Preterm Labor Information Preterm labor is when labor starts at less than 37 weeks of pregnancy. The normal length of a pregnancy is 39 to 41 weeks. CAUSES Often, there is no identifiable underlying cause as to why a woman goes into preterm labor. One of the most common known causes of preterm labor is infection. Infections of the uterus, cervix, vagina, amniotic sac, bladder, kidney, or even the lungs (pneumonia) can cause labor to start. Other suspected causes of preterm labor include:   Urogenital infections, such as yeast infections and bacterial vaginosis.   Uterine abnormalities (uterine shape, uterine septum, fibroids, or bleeding from the placenta).   A cervix that has been operated on (it may fail to stay closed).   Malformations in the fetus.   Multiple gestations (twins, triplets, and so on).   Breakage of the amniotic sac.  RISK FACTORS  Having a previous history of preterm labor.   Having premature rupture of membranes (PROM).   Having a placenta that covers the opening of the cervix (placenta previa).   Having a placenta that separates from the uterus (placental abruption).   Having a cervix that is too weak to hold the fetus in the uterus (incompetent cervix).   Having too much fluid in the amniotic sac (polyhydramnios).   Taking illegal drugs or smoking while pregnant.   Not gaining enough weight while pregnant.   Being younger than 1118 and older than 28 years old.   Having a low socioeconomic status.   Being African American. SYMPTOMS Signs and symptoms of preterm labor include:   Menstrual-like cramps, abdominal pain, or back pain.  Uterine contractions that are regular, as frequent as six in an hour, regardless of their intensity (may be mild or painful).  Contractions that start on the top of the uterus and spread down to the lower abdomen and back.   A sense of increased pelvic pressure.   A watery or bloody mucus discharge that  comes from the vagina.  TREATMENT Depending on the length of the pregnancy and other circumstances, your health care provider may suggest bed rest. If necessary, there are medicines that can be given to stop contractions and to mature the fetal lungs. If labor happens before 34 weeks of pregnancy, a prolonged hospital stay may be recommended. Treatment depends on the condition of both you and the fetus.  WHAT SHOULD YOU DO IF YOU THINK YOU ARE IN PRETERM LABOR? Call your health care provider right away. You will need to go to the hospital to get checked immediately. HOW CAN YOU PREVENT PRETERM LABOR IN FUTURE PREGNANCIES? You should:   Stop smoking if you smoke.  Maintain healthy weight gain and avoid chemicals and drugs that are not necessary.  Be watchful for any type of infection.  Inform your health care provider if you have a known history of preterm labor. Document Released: 02/05/2004 Document Revised: 07/18/2013 Document Reviewed: 12/18/2012 [facility] Patient Information 2015 [place], [place]. This information is not intended to replace advice given to you by your health care provider. Make sure you discuss any questions you have with your health care provider.  Bacterial Vaginosis Bacterial vaginosis is a vaginal infection that occurs when the normal balance of bacteria in the vagina is disrupted. It results from an overgrowth of certain bacteria. This is the most common vaginal infection in women of childbearing age. Treatment is important to prevent complications, especially in pregnant women, as it can cause a premature delivery. CAUSES  Bacterial vaginosis is caused  by an increase in harmful bacteria that are normally present in smaller amounts in the vagina. Several different kinds of bacteria can cause bacterial vaginosis. However, the reason that the condition develops is not fully understood. RISK FACTORS Certain activities or behaviors can put you at an increased risk of  developing bacterial vaginosis, including:  Having a new sex partner or multiple sex partners.  Douching.  Using an intrauterine device (IUD) for contraception. Women do not get bacterial vaginosis from toilet seats, bedding, swimming pools, or contact with objects around them. SIGNS AND SYMPTOMS  Some women with bacterial vaginosis have no signs or symptoms. Common symptoms include:  Grey vaginal discharge.  A fishlike odor with discharge, especially after sexual intercourse.  Itching or burning of the vagina and vulva.  Burning or pain with urination. DIAGNOSIS  Your health care provider will take a medical history and examine the vagina for signs of bacterial vaginosis. A sample of vaginal fluid may be taken. Your health care provider will look at this sample under a microscope to check for bacteria and abnormal cells. A vaginal pH test may also be done.  TREATMENT  Bacterial vaginosis may be treated with antibiotic medicines. These may be given in the form of a pill or a vaginal cream. A second round of antibiotics may be prescribed if the condition comes back after treatment.  HOME CARE INSTRUCTIONS   Only take over-the-counter or prescription medicines as directed by your health care provider.  If antibiotic medicine was prescribed, take it as directed. Make sure you finish it even if you start to feel better.  Do not have sex until treatment is completed.  Tell all sexual partners that you have a vaginal infection. They should see their health care provider and be treated if they have problems, such as a mild rash or itching.  Practice safe sex by using condoms and only having one sex partner. SEEK MEDICAL CARE IF:   Your symptoms are not improving after 3 days of treatment.  You have increased discharge or pain.  You have a fever. MAKE SURE YOU:   Understand these instructions.  Will watch your condition.  Will get help right away if you are not doing well or get  worse. FOR MORE INFORMATION  Centers for Disease Control and Prevention, Division of STD Prevention: Solution[employer]s.co.zawww.cdc.gov/std American Sexual Health Association (ASHA): www.ashastd.org  Document Released: 11/15/2005 Document Revised: 09/05/2013 Document Reviewed: 06/27/2013 [facility] Patient Information 2015 [place], [place]. This information is not intended to replace advice given to you by your health care provider. Make sure you discuss any questions you have with your health care provider.  Urinary Tract Infection Urinary tract infections (UTIs) can develop anywhere along your urinary tract. Your urinary tract is your body's drainage system for removing wastes and extra water. Your urinary tract includes two kidneys, two ureters, a bladder, and a urethra. Your kidneys are a pair of bean-shaped organs. Each kidney is about the size of your fist. They are located below your ribs, one on each side of your spine. CAUSES Infections are caused by microbes, which are microscopic organisms, including fungi, viruses, and bacteria. These organisms are so small that they can only be seen through a microscope. Bacteria are the microbes that most commonly cause UTIs. SYMPTOMS  Symptoms of UTIs may vary by age and gender of the patient and by the location of the infection. Symptoms in young women typically include a frequent and intense urge to urinate and a painful, burning  feeling in the bladder or urethra during urination. Older women and men are more likely to be tired, shaky, and weak and have muscle aches and abdominal pain. A fever may mean the infection is in your kidneys. Other symptoms of a kidney infection include pain in your back or sides below the ribs, nausea, and vomiting. DIAGNOSIS To diagnose a UTI, your caregiver will ask you about your symptoms. Your caregiver also will ask to provide a urine sample. The urine sample will be tested for bacteria and white blood cells. White blood cells are made by your  body to help fight infection. TREATMENT  Typically, UTIs can be treated with medication. Because most UTIs are caused by a bacterial infection, they usually can be treated with the use of antibiotics. The choice of antibiotic and length of treatment depend on your symptoms and the type of bacteria causing your infection. HOME CARE INSTRUCTIONS  If you were prescribed antibiotics, take them exactly as your caregiver instructs you. Finish the medication even if you feel better after you have only taken some of the medication.  Drink enough water and fluids to keep your urine clear or pale yellow.  Avoid caffeine, tea, and carbonated beverages. They tend to irritate your bladder.  Empty your bladder often. Avoid holding urine for long periods of time.  Empty your bladder before and after sexual intercourse.  After a bowel movement, women should cleanse from front to back. Use each tissue only once. SEEK MEDICAL CARE IF:   You have back pain.  You develop a fever.  Your symptoms do not begin to resolve within 3 days. SEEK IMMEDIATE MEDICAL CARE IF:   You have severe back pain or lower abdominal pain.  You develop chills.  You have nausea or vomiting.  You have continued burning or discomfort with urination. MAKE SURE YOU:   Understand these instructions.  Will watch your condition.  Will get help right away if you are not doing well or get worse. Document Released: 08/25/2005 Document Revised: 05/16/2012 Document Reviewed: 12/24/2011 [facility] Patient Information 2015 [place], [place]. This information is not intended to replace advice given to you by your health care provider. Make sure you discuss any questions you have with your health care provider.

## 2015-01-10 NOTE — MAU Provider Note (Signed)
Chief Complaint:  Labor Eval   First Provider Initiated Contact with Patient 01/10/15 1633     HPI: Mindy Davies is a 29 y.o. G1P0000 at [redacted]w[redacted]d who presents to maternity admissions reporting constant pelvic pressure pressure and uterine tightening ~3 x pr hour since this morning and upper abd pain that is worse at night x 3 days. Some relief of upper abd pain w/ Tums.  Denies fever, chills, leakage of fluid or vaginal bleeding. Good fetal movement.   Pregnancy Course: In antenatal testing for Polyhydramnios.   Past Medical History: Past Medical History  Diagnosis Date  . Medical history non-contributory     Past obstetric history: OB History  Gravida Para Term Preterm AB SAB TAB Ectopic Multiple Living     # Outcome Date GA Lbr Len/2nd Weight Sex Delivery Anes PTL Lv  1 Current               Past Surgical History: Past Surgical History  Procedure Laterality Date  . Wisdom tooth extraction    . Bunionectomy       Family History: Family History  Problem Relation Age of Onset  . Cancer Paternal Grandmother     Social History: History  Substance Use Topics  . Smoking status: Never Smoker   . Smokeless tobacco: Never Used  . Alcohol Use: No    Allergies: No Known Allergies  Meds:  No prescriptions prior to admission    ROS:  Review of Systems  Constitutional: Negative for fever and chills.  Eyes: Negative for blurred vision.  Cardiovascular: Negative for chest pain.  Gastrointestinal: Positive for heartburn and abdominal pain. Negative for nausea, vomiting, diarrhea and constipation.  Genitourinary: Positive for frequency. Negative for dysuria, urgency, hematuria and flank pain.       Neg for vaginal bleeding, vaginal discharge, odor or itching.   Neurological: Negative for headaches.    Physical Exam  Blood pressure 123/80, pulse 68, temperature 98.3 F (36.8 C), temperature source Oral, resp. rate 18, height  (1.702 m), weight  122.154 kg (269 lb 4.8 oz), last menstrual period 03/11/2014. GENERAL: Well-developed, well-nourished female in no acute distress.  HEENT: normocephalic HEART: normal rate RESP: normal effort ABDOMEN: Soft, non-tender, gravid size>dates. No CVAT. EXTREMITIES: Nontender, no edema NEURO: alert and oriented SPECULUM EXAM: NEFG, moderate amount of thin, white, malodorous discharge, no blood, cervix clean Dilation: Closed Effacement (%): Thick Exam by:: V.Kinzie Wickes,CNM  FHT:  Baseline 140 , moderate variability, accelerations present, no decelerations Contractions: UI   Labs: Results for orders placed or performed during the hospital encounter of 01/10/15 (from the past 24 hour(s))  Urinalysis, Routine w reflex microscopic     Status: Abnormal   Collection Time: 01/10/15  4:03 PM  Result Value Ref Range   Color, Urine YELLOW YELLOW   APPearance CLEAR CLEAR   Specific Gravity, Urine 1.010 1.005 - 1.030   pH 6.5 5.0 - 8.0   Glucose, UA NEGATIVE NEGATIVE mg/dL   Hgb urine dipstick MODERATE (A) NEGATIVE   Bilirubin Urine NEGATIVE NEGATIVE   Ketones, ur NEGATIVE NEGATIVE mg/dL   Protein, ur NEGATIVE NEGATIVE mg/dL   Urobilinogen, UA 0.2 0.0 - 1.0 mg/dL   Nitrite NEGATIVE NEGATIVE   Leukocytes, UA SMALL (A) NEGATIVE  Urine microscopic-add on     Status: Abnormal   Collection Time: 01/10/15  4:03 PM  Result Value Ref Range   Squamous Epithelial / LPF RARE RARE   WBC,  UA 3-6 <3 WBC/hpf   RBC / HPF 11-20 <3 RBC/hpf   Bacteria, UA MANY (A) RARE  CBC     Status: Abnormal   Collection Time: 01/10/15  4:27 PM  Result Value Ref Range   WBC 11.2 (H) 4.0 - 10.5 K/uL   RBC 3.32 (L) 3.87 - 5.11 MIL/uL   Hemoglobin 10.5 (L) 12.0 - 15.0 g/dL   HCT 16.130.4 (L) 09.636.0 - 04.546.0 %   MCV 91.6 78.0 - 100.0 fL   MCH 31.6 26.0 - 34.0 pg   MCHC 34.5 30.0 - 36.0 g/dL   RDW 40.912.4 81.111.5 - 91.415.5 %   Platelets 235 150 - 400 K/uL  Comprehensive metabolic panel     Status: Abnormal   Collection Time: 01/10/15  4:27 PM   Result Value Ref Range   Sodium 136 135 - 145 mmol/L   Potassium 4.4 3.5 - 5.1 mmol/L   Chloride 101 96 - 112 mmol/L   CO2 26 19 - 32 mmol/L   Glucose, Bld 87 70 - 99 mg/dL   BUN 14 6 - 23 mg/dL   Creatinine, Ser 7.820.82 0.50 - 1.10 mg/dL   Calcium 95.611.9 (H) 8.4 - 10.5 mg/dL   Total Protein 7.1 6.0 - 8.3 g/dL   Albumin 2.8 (L) 3.5 - 5.2 g/dL   AST 17 0 - 37 U/L   ALT 12 0 - 35 U/L   Alkaline Phosphatase 101 39 - 117 U/L   Total Bilirubin 0.4 0.3 - 1.2 mg/dL   GFR calc non Af Amer >90 >90 mL/min   GFR calc Af Amer >90 >90 mL/min   Anion gap 9 5 - 15  Fetal fibronectin     Status: Abnormal   Collection Time: 01/10/15  4:45 PM  Result Value Ref Range   Fetal Fibronectin POSITIVE (A) NEGATIVE  Wet prep, genital     Status: Abnormal   Collection Time: 01/10/15  4:45 PM  Result Value Ref Range   Yeast Wet Prep HPF POC NONE SEEN NONE SEEN   Trich, Wet Prep NONE SEEN NONE SEEN   Clue Cells Wet Prep HPF POC MODERATE (A) NONE SEEN   WBC, Wet Prep HPF POC MODERATE (A) NONE SEEN    Imaging:   MAU Course: Upper abd pain , resolved w/ GI cocktail. BMZ given.   Assessment: 1. UTI in pregnancy, third trimester   2. BV (bacterial vaginosis)   3. Gastroesophageal reflux during pregnancy in third trimester, antepartum   4. Preterm contractions, third trimester    Plan: Discharge home in stable condition. Preterm labor precautions and fetal kick counts. Increase fluids and rest.     Follow-up Information    [facility]   Specialty:  Obstetrics and Gynecology   Why:  As scheduled   Contact information:   [address] [place] [id number] [phone]      [facility] On 01/11/2015.   Why:  at 6 pm for second steroid shot and  As needed if symptoms worsen   Contact information:   [address] [id number] [place] [id number] [phone]        Medication List    TAKE these medications        famotidine 20 MG tablet  Commonly known as:  PEPCID  Take 1 tablet (20 mg total) by mouth 2 (two) times daily.     metroNIDAZOLE 500 MG tablet  Commonly known as:  FLAGYL  Take 1 tablet (500 mg total) by mouth 2 (two) times daily.     nitrofurantoin (macrocrystal-monohydrate) 100 MG capsule  Commonly known as:  MACROBID  Take 1 capsule (100 mg total) by mouth 2 (two) times daily.     prenatal vitamin w/FE, FA 27-1 MG Tabs tablet  Take 1 tablet by mouth daily at 12 noon.       [employer], [place] 01/10/2015 6:41 PM

## 2015-01-11 ENCOUNTER — Inpatient Hospital Stay (HOSPITAL_COMMUNITY)
Admission: AD | Admit: 2015-01-11 | Discharge: 2015-01-11 | Disposition: A | Discharge status: Home / Self Care | Payer: BLUE CROSS/BLUE SHIELD | Source: Ambulatory Visit | Attending: Obstetrics & Gynecology | Admitting: Obstetrics & Gynecology

## 2015-01-11 DIAGNOSIS — Z3A34 34 weeks gestation of pregnancy: Secondary | ICD-10-CM | POA: Diagnosis not present

## 2015-01-11 DIAGNOSIS — O2343 Unspecified infection of urinary tract in pregnancy, third trimester: Secondary | ICD-10-CM | POA: Diagnosis not present

## 2015-01-11 DIAGNOSIS — Z3A33 33 weeks gestation of pregnancy: Secondary | ICD-10-CM | POA: Diagnosis not present

## 2015-01-11 MED ORDER — BETAMETHASONE SOD PHOS & ACET 6 (3-3) MG/ML IJ SUSP
12.0000 mg | Freq: Once | INTRAMUSCULAR | Status: AC
  Administered 2015-01-11: 12 mg via INTRAMUSCULAR
  Filled 2015-01-11: qty 2

## 2015-01-11 NOTE — MAU Note (Signed)
Pt here for second dose of betamethasone.

## 2015-01-14 ENCOUNTER — Ambulatory Visit (INDEPENDENT_AMBULATORY_CARE_PROVIDER_SITE_OTHER): Payer: BLUE CROSS/BLUE SHIELD | Admitting: *Deleted

## 2015-01-14 VITALS — BP 124/77 | HR 78

## 2015-01-14 DIAGNOSIS — O403XX1 Polyhydramnios, third trimester, fetus 1: Secondary | ICD-10-CM

## 2015-01-14 DIAGNOSIS — O403XX0 Polyhydramnios, third trimester, not applicable or unspecified: Secondary | ICD-10-CM

## 2015-01-14 LAB — US OB FOLLOW UP

## 2015-01-14 NOTE — Progress Notes (Signed)
NST reactive.

## 2015-01-16 ENCOUNTER — Ambulatory Visit (INDEPENDENT_AMBULATORY_CARE_PROVIDER_SITE_OTHER): Payer: BLUE CROSS/BLUE SHIELD | Admitting: Family Medicine

## 2015-01-16 VITALS — BP 120/71 | HR 78 | Temp 98.6°F | Wt 267.1 lb

## 2015-01-16 DIAGNOSIS — O403XX1 Polyhydramnios, third trimester, fetus 1: Secondary | ICD-10-CM

## 2015-01-16 DIAGNOSIS — O0933 Supervision of pregnancy with insufficient antenatal care, third trimester: Secondary | ICD-10-CM

## 2015-01-16 DIAGNOSIS — O403XX0 Polyhydramnios, third trimester, not applicable or unspecified: Secondary | ICD-10-CM | POA: Diagnosis not present

## 2015-01-16 LAB — POCT URINALYSIS DIP (DEVICE)
Bilirubin Urine: NEGATIVE
GLUCOSE, UA: NEGATIVE mg/dL
Ketones, ur: NEGATIVE mg/dL
Nitrite: NEGATIVE
PROTEIN: NEGATIVE mg/dL
SPECIFIC GRAVITY, URINE: 1.015 (ref 1.005–1.030)
UROBILINOGEN UA: 0.2 mg/dL (ref 0.0–1.0)
pH: 7 (ref 5.0–8.0)

## 2015-01-16 NOTE — Progress Notes (Signed)
Patient is 29 y.o. G1P0000 7391w4d.  +FM, denies LOF, VB, vaginal discharge.  Overall feeling well. #contractions: usually when at work, irregular up to 2-3/week, rarely are strong #poly => no longer has poly, recent sonos AFI wnl => no longer needs bi weekly NST, discussed with pt/nursing (see Lisa's note from 2018w3d)

## 2015-01-16 NOTE — Progress Notes (Signed)
Patient dropped off FMLA papers today; ROI signed

## 2015-01-21 ENCOUNTER — Other Ambulatory Visit: Payer: BLUE CROSS/BLUE SHIELD

## 2015-01-21 ENCOUNTER — Telehealth: Payer: Self-pay | Admitting: *Deleted

## 2015-01-21 NOTE — Telephone Encounter (Signed)
Called pt regarding missed appt today @ 0800. I left a message stating that I have openings at 1000 or 1100 today. Please call back and let us know if she can come in later today.

## 2015-01-22 ENCOUNTER — Encounter: Payer: Self-pay | Admitting: Obstetrics & Gynecology

## 2015-01-22 NOTE — Telephone Encounter (Signed)
Called patient and she states that she was told at her last doctor's visit she no longer needed twice a week NSTs but needed them once a week and she has an appt with us tomorrow at 730 for doctors visit and NST. Told patient to discuss with provider tomorrow if she needs to be having the NSTs period. Patient verbalized understanding and had no questions

## 2015-01-23 ENCOUNTER — Encounter: Payer: Self-pay | Admitting: Obstetrics and Gynecology

## 2015-01-23 ENCOUNTER — Ambulatory Visit (INDEPENDENT_AMBULATORY_CARE_PROVIDER_SITE_OTHER): Payer: BLUE CROSS/BLUE SHIELD | Admitting: Obstetrics and Gynecology

## 2015-01-23 VITALS — BP 131/75 | HR 90 | Wt 271.9 lb

## 2015-01-23 DIAGNOSIS — O0933 Supervision of pregnancy with insufficient antenatal care, third trimester: Secondary | ICD-10-CM

## 2015-01-23 DIAGNOSIS — O403XX1 Polyhydramnios, third trimester, fetus 1: Secondary | ICD-10-CM

## 2015-01-23 DIAGNOSIS — O403XX0 Polyhydramnios, third trimester, not applicable or unspecified: Secondary | ICD-10-CM | POA: Diagnosis not present

## 2015-01-23 LAB — POCT URINALYSIS DIP (DEVICE)
BILIRUBIN URINE: NEGATIVE
GLUCOSE, UA: NEGATIVE mg/dL
KETONES UR: NEGATIVE mg/dL
Nitrite: NEGATIVE
Protein, ur: NEGATIVE mg/dL
Specific Gravity, Urine: 1.015 (ref 1.005–1.030)
Urobilinogen, UA: 0.2 mg/dL (ref 0.0–1.0)
pH: 7 (ref 5.0–8.0)

## 2015-01-23 NOTE — Progress Notes (Signed)
Patient is doing well without complaints. FM/PTL precautions reviewed. NST reviewed and reactive Cultures next visit 

## 2015-01-23 NOTE — Progress Notes (Signed)
OBF/NST 

## 2015-01-23 NOTE — Progress Notes (Signed)
DR. Constant in NST room to talk with patient,  Dr. Jolayne Pantheronstant reviewed strip.

## 2015-01-28 ENCOUNTER — Ambulatory Visit (HOSPITAL_COMMUNITY)
Admission: RE | Admit: 2015-01-28 | Discharge: 2015-01-28 | Disposition: A | Discharge status: Home / Self Care | Payer: BLUE CROSS/BLUE SHIELD | Source: Ambulatory Visit | Attending: Advanced Practice Midwife | Admitting: Advanced Practice Midwife

## 2015-01-28 ENCOUNTER — Encounter: Payer: Self-pay | Admitting: *Deleted

## 2015-01-28 ENCOUNTER — Ambulatory Visit (INDEPENDENT_AMBULATORY_CARE_PROVIDER_SITE_OTHER): Payer: BLUE CROSS/BLUE SHIELD | Admitting: *Deleted

## 2015-01-28 VITALS — BP 123/71 | HR 75

## 2015-01-28 DIAGNOSIS — Z0489 Encounter for examination and observation for other specified reasons: Secondary | ICD-10-CM | POA: Insufficient documentation

## 2015-01-28 DIAGNOSIS — O99213 Obesity complicating pregnancy, third trimester: Secondary | ICD-10-CM | POA: Diagnosis not present

## 2015-01-28 DIAGNOSIS — Z3A36 36 weeks gestation of pregnancy: Secondary | ICD-10-CM | POA: Diagnosis not present

## 2015-01-28 DIAGNOSIS — Z3403 Encounter for supervision of normal first pregnancy, third trimester: Secondary | ICD-10-CM

## 2015-01-28 DIAGNOSIS — O403XX0 Polyhydramnios, third trimester, not applicable or unspecified: Secondary | ICD-10-CM | POA: Insufficient documentation

## 2015-01-28 DIAGNOSIS — O403XX1 Polyhydramnios, third trimester, fetus 1: Secondary | ICD-10-CM | POA: Diagnosis not present

## 2015-01-28 DIAGNOSIS — IMO0002 Reserved for concepts with insufficient information to code with codable children: Secondary | ICD-10-CM | POA: Insufficient documentation

## 2015-01-28 DIAGNOSIS — O409XX0 Polyhydramnios, unspecified trimester, not applicable or unspecified: Secondary | ICD-10-CM | POA: Insufficient documentation

## 2015-01-28 LAB — POCT URINALYSIS DIP (DEVICE)
BILIRUBIN URINE: NEGATIVE
GLUCOSE, UA: NEGATIVE mg/dL
Ketones, ur: NEGATIVE mg/dL
NITRITE: NEGATIVE
PH: 7 (ref 5.0–8.0)
PROTEIN: NEGATIVE mg/dL
Specific Gravity, Urine: 1.015 (ref 1.005–1.030)
UROBILINOGEN UA: 0.2 mg/dL (ref 0.0–1.0)

## 2015-01-28 NOTE — Progress Notes (Signed)
NST reactive.

## 2015-01-28 NOTE — Progress Notes (Signed)
Continue twice weekly fetal testing due to polyhydramnios. Per phone report of US today by [place] Contrasachel (sonographer); AFI = 26.8cm, EFW 65%.

## 2015-01-28 NOTE — Progress Notes (Signed)
Additional FMLA paperwork completed/faxed per patient request.

## 2015-01-30 ENCOUNTER — Ambulatory Visit (INDEPENDENT_AMBULATORY_CARE_PROVIDER_SITE_OTHER): Payer: BLUE CROSS/BLUE SHIELD | Admitting: Family Medicine

## 2015-01-30 ENCOUNTER — Other Ambulatory Visit: Payer: Self-pay | Admitting: Family Medicine

## 2015-01-30 VITALS — BP 132/77 | HR 73 | Wt 272.1 lb

## 2015-01-30 DIAGNOSIS — O403XX1 Polyhydramnios, third trimester, fetus 1: Secondary | ICD-10-CM | POA: Diagnosis not present

## 2015-01-30 DIAGNOSIS — Z3493 Encounter for supervision of normal pregnancy, unspecified, third trimester: Secondary | ICD-10-CM

## 2015-01-30 DIAGNOSIS — O0933 Supervision of pregnancy with insufficient antenatal care, third trimester: Secondary | ICD-10-CM

## 2015-01-30 LAB — POCT URINALYSIS DIP (DEVICE)
Bilirubin Urine: NEGATIVE
Glucose, UA: NEGATIVE mg/dL
KETONES UR: NEGATIVE mg/dL
Nitrite: NEGATIVE
PROTEIN: NEGATIVE mg/dL
Specific Gravity, Urine: 1.015 (ref 1.005–1.030)
Urobilinogen, UA: 0.2 mg/dL (ref 0.0–1.0)
pH: 7 (ref 5.0–8.0)

## 2015-01-30 LAB — OB RESULTS CONSOLE GBS: STREP GROUP B AG: POSITIVE

## 2015-01-30 LAB — OB RESULTS CONSOLE GC/CHLAMYDIA
Chlamydia: NEGATIVE
Gonorrhea: NEGATIVE

## 2015-01-30 NOTE — Patient Instructions (Signed)
Third Trimester of Pregnancy The third trimester is from week 29 through week 42, months 7 through 9. The third trimester is a time when the fetus is growing rapidly. At the end of the ninth month, the fetus is about 20 inches in length and weighs 6-10 pounds.  BODY CHANGES Your body goes through many changes during pregnancy. The changes vary from woman to woman.   Your weight will continue to increase. You can expect to gain 25-35 pounds (11-16 kg) by the end of the pregnancy.  You may begin to get stretch marks on your hips, abdomen, and breasts.  You may urinate more often because the fetus is moving lower into your pelvis and pressing on your bladder.  You may develop or continue to have heartburn as a result of your pregnancy.  You may develop constipation because certain hormones are causing the muscles that push waste through your intestines to slow down.  You may develop hemorrhoids or swollen, bulging veins (varicose veins).  You may have pelvic pain because of the weight gain and pregnancy hormones relaxing your joints between the bones in your pelvis. Backaches may result from overexertion of the muscles supporting your posture.  You may have changes in your hair. These can include thickening of your hair, rapid growth, and changes in texture. Some women also have hair loss during or after pregnancy, or hair that feels dry or thin. Your hair will most likely return to normal after your baby is born.  Your breasts will continue to grow and be tender. A yellow discharge may leak from your breasts called colostrum.  Your belly button may stick out.  You may feel short of breath because of your expanding uterus.  You may notice the fetus "dropping," or moving lower in your abdomen.  You may have a bloody mucus discharge. This usually occurs a few days to a week before labor begins.  Your cervix becomes thin and soft (effaced) near your due date. WHAT TO EXPECT AT YOUR PRENATAL  EXAMS  You will have prenatal exams every 2 weeks until week 36. Then, you will have weekly prenatal exams. During a routine prenatal visit:  You will be weighed to make sure you and the fetus are growing normally.  Your blood pressure is taken.  Your abdomen will be measured to track your baby's growth.  The fetal heartbeat will be listened to.  Any test results from the previous visit will be discussed.  You may have a cervical check near your due date to see if you have effaced. At around 36 weeks, your caregiver will check your cervix. At the same time, your caregiver will also perform a test on the secretions of the vaginal tissue. This test is to determine if a type of bacteria, Group B streptococcus, is present. Your caregiver will explain this further. Your caregiver may ask you:  What your birth plan is.  How you are feeling.  If you are feeling the baby move.  If you have had any abnormal symptoms, such as leaking fluid, bleeding, severe headaches, or abdominal cramping.  If you have any questions. Other tests or screenings that may be performed during your third trimester include:  Blood tests that check for low iron levels (anemia).  Fetal testing to check the health, activity level, and growth of the fetus. Testing is done if you have certain medical conditions or if there are problems during the pregnancy. FALSE LABOR You may feel small, irregular contractions that   eventually go away. These are called Braxton Hicks contractions, or false labor. Contractions may last for hours, days, or even weeks before true labor sets in. If contractions come at regular intervals, intensify, or become painful, it is best to be seen by your caregiver.  SIGNS OF LABOR   Menstrual-like cramps.  Contractions that are 5 minutes apart or less.  Contractions that start on the top of the uterus and spread down to the lower abdomen and back.  A sense of increased pelvic pressure or back  pain.  A watery or bloody mucus discharge that comes from the vagina. If you have any of these signs before the 37th week of pregnancy, call your caregiver right away. You need to go to the hospital to get checked immediately. HOME CARE INSTRUCTIONS   Avoid all smoking, herbs, alcohol, and unprescribed drugs. These chemicals affect the formation and growth of the baby.  Follow your caregiver's instructions regarding medicine use. There are medicines that are either safe or unsafe to take during pregnancy.  Exercise only as directed by your caregiver. Experiencing uterine cramps is a good sign to stop exercising.  Continue to eat regular, healthy meals.  Wear a good support bra for breast tenderness.  Do not use hot tubs, steam rooms, or saunas.  Wear your seat belt at all times when driving.  Avoid raw meat, uncooked cheese, cat litter boxes, and soil used by cats. These carry germs that can cause birth defects in the baby.  Take your prenatal vitamins.  Try taking a stool softener (if your caregiver approves) if you develop constipation. Eat more high-fiber foods, such as fresh vegetables or fruit and whole grains. Drink plenty of fluids to keep your urine clear or pale yellow.  Take warm sitz baths to soothe any pain or discomfort caused by hemorrhoids. Use hemorrhoid cream if your caregiver approves.  If you develop varicose veins, wear support hose. Elevate your feet for 15 minutes, 3-4 times a day. Limit salt in your diet.  Avoid heavy lifting, wear low heal shoes, and practice good posture.  Rest a lot with your legs elevated if you have leg cramps or low back pain.  Visit your dentist if you have not gone during your pregnancy. Use a soft toothbrush to brush your teeth and be gentle when you floss.  A sexual relationship may be continued unless your caregiver directs you otherwise.  Do not travel far distances unless it is absolutely necessary and only with the approval  of your caregiver.  Take prenatal classes to understand, practice, and ask questions about the labor and delivery.  Make a trial run to the hospital.  Pack your hospital bag.  Prepare the baby's nursery.  Continue to go to all your prenatal visits as directed by your caregiver. SEEK MEDICAL CARE IF:  You are unsure if you are in labor or if your water has broken.  You have dizziness.  You have mild pelvic cramps, pelvic pressure, or nagging pain in your abdominal area.  You have persistent nausea, vomiting, or diarrhea.  You have a bad smelling vaginal discharge.  You have pain with urination. SEEK IMMEDIATE MEDICAL CARE IF:   You have a fever.  You are leaking fluid from your vagina.  You have spotting or bleeding from your vagina.  You have severe abdominal cramping or pain.  You have rapid weight loss or gain.  You have shortness of breath with chest pain.  You notice sudden or extreme swelling   of your face, hands, ankles, feet, or legs.  You have not felt your baby move in over an hour.  You have severe headaches that do not go away with medicine.  You have vision changes. Document Released: 11/09/2001 Document Revised: 11/20/2013 Document Reviewed: 01/16/2013 ExitCare Patient Information 2015 [employer]. This information is not intended to replace advice given to you by your health care provider. Make sure you discuss any questions you have with your health care provider.  

## 2015-01-30 NOTE — Progress Notes (Signed)
NST reactive GC/CT, GBS collected today.  Labor precautions and fetal movement precautions given. No other concerns

## 2015-01-30 NOTE — Progress Notes (Signed)
OBF/NST Cultures

## 2015-01-31 LAB — GC/CHLAMYDIA PROBE AMP
CT Probe RNA: NEGATIVE
GC Probe RNA: NEGATIVE

## 2015-01-31 LAB — CULTURE, BETA STREP (GROUP B ONLY)

## 2015-02-04 ENCOUNTER — Telehealth: Payer: Self-pay | Admitting: *Deleted

## 2015-02-04 ENCOUNTER — Other Ambulatory Visit: Payer: BLUE CROSS/BLUE SHIELD

## 2015-02-04 NOTE — Telephone Encounter (Signed)
Called pt and left message stating that I am checking on her since she missed her appt today @ 0800. Please call back if she is having any problems or questions. Next scheduled appt is 3/10 @ 0730.

## 2015-02-06 ENCOUNTER — Ambulatory Visit (INDEPENDENT_AMBULATORY_CARE_PROVIDER_SITE_OTHER): Payer: BLUE CROSS/BLUE SHIELD | Admitting: Obstetrics & Gynecology

## 2015-02-06 VITALS — BP 131/77 | HR 72 | Wt 279.8 lb

## 2015-02-06 DIAGNOSIS — O403XX1 Polyhydramnios, third trimester, fetus 1: Secondary | ICD-10-CM

## 2015-02-06 DIAGNOSIS — O0933 Supervision of pregnancy with insufficient antenatal care, third trimester: Secondary | ICD-10-CM | POA: Diagnosis not present

## 2015-02-06 LAB — US OB FOLLOW UP

## 2015-02-06 LAB — POCT URINALYSIS DIP (DEVICE)
BILIRUBIN URINE: NEGATIVE
Glucose, UA: NEGATIVE mg/dL
Ketones, ur: NEGATIVE mg/dL
Nitrite: NEGATIVE
PH: 7 (ref 5.0–8.0)
Protein, ur: NEGATIVE mg/dL
Specific Gravity, Urine: 1.015 (ref 1.005–1.030)
UROBILINOGEN UA: 0.2 mg/dL (ref 0.0–1.0)

## 2015-02-06 NOTE — Patient Instructions (Signed)
Third Trimester of Pregnancy The third trimester is from week 29 through week 42, months 7 through 9. The third trimester is a time when the fetus is growing rapidly. At the end of the ninth month, the fetus is about 20 inches in length and weighs 6-10 pounds.  BODY CHANGES Your body goes through many changes during pregnancy. The changes vary from woman to woman.   Your weight will continue to increase. You can expect to gain 25-35 pounds (11-16 kg) by the end of the pregnancy.  You may begin to get stretch marks on your hips, abdomen, and breasts.  You may urinate more often because the fetus is moving lower into your pelvis and pressing on your bladder.  You may develop or continue to have heartburn as a result of your pregnancy.  You may develop constipation because certain hormones are causing the muscles that push waste through your intestines to slow down.  You may develop hemorrhoids or swollen, bulging veins (varicose veins).  You may have pelvic pain because of the weight gain and pregnancy hormones relaxing your joints between the bones in your pelvis. Backaches may result from overexertion of the muscles supporting your posture.  You may have changes in your hair. These can include thickening of your hair, rapid growth, and changes in texture. Some women also have hair loss during or after pregnancy, or hair that feels dry or thin. Your hair will most likely return to normal after your baby is born.  Your breasts will continue to grow and be tender. A yellow discharge may leak from your breasts called colostrum.  Your belly button may stick out.  You may feel short of breath because of your expanding uterus.  You may notice the fetus "dropping," or moving lower in your abdomen.  You may have a bloody mucus discharge. This usually occurs a few days to a week before labor begins.  Your cervix becomes thin and soft (effaced) near your due date. WHAT TO EXPECT AT YOUR PRENATAL  EXAMS  You will have prenatal exams every 2 weeks until week 36. Then, you will have weekly prenatal exams. During a routine prenatal visit:  You will be weighed to make sure you and the fetus are growing normally.  Your blood pressure is taken.  Your abdomen will be measured to track your baby's growth.  The fetal heartbeat will be listened to.  Any test results from the previous visit will be discussed.  You may have a cervical check near your due date to see if you have effaced. At around 36 weeks, your caregiver will check your cervix. At the same time, your caregiver will also perform a test on the secretions of the vaginal tissue. This test is to determine if a type of bacteria, Group B streptococcus, is present. Your caregiver will explain this further. Your caregiver may ask you:  What your birth plan is.  How you are feeling.  If you are feeling the baby move.  If you have had any abnormal symptoms, such as leaking fluid, bleeding, severe headaches, or abdominal cramping.  If you have any questions. Other tests or screenings that may be performed during your third trimester include:  Blood tests that check for low iron levels (anemia).  Fetal testing to check the health, activity level, and growth of the fetus. Testing is done if you have certain medical conditions or if there are problems during the pregnancy. FALSE LABOR You may feel small, irregular contractions that   eventually go away. These are called Braxton Hicks contractions, or false labor. Contractions may last for hours, days, or even weeks before true labor sets in. If contractions come at regular intervals, intensify, or become painful, it is best to be seen by your caregiver.  SIGNS OF LABOR   Menstrual-like cramps.  Contractions that are 5 minutes apart or less.  Contractions that start on the top of the uterus and spread down to the lower abdomen and back.  A sense of increased pelvic pressure or back  pain.  A watery or bloody mucus discharge that comes from the vagina. If you have any of these signs before the 37th week of pregnancy, call your caregiver right away. You need to go to the hospital to get checked immediately. HOME CARE INSTRUCTIONS   Avoid all smoking, herbs, alcohol, and unprescribed drugs. These chemicals affect the formation and growth of the baby.  Follow your caregiver's instructions regarding medicine use. There are medicines that are either safe or unsafe to take during pregnancy.  Exercise only as directed by your caregiver. Experiencing uterine cramps is a good sign to stop exercising.  Continue to eat regular, healthy meals.  Wear a good support bra for breast tenderness.  Do not use hot tubs, steam rooms, or saunas.  Wear your seat belt at all times when driving.  Avoid raw meat, uncooked cheese, cat litter boxes, and soil used by cats. These carry germs that can cause birth defects in the baby.  Take your prenatal vitamins.  Try taking a stool softener (if your caregiver approves) if you develop constipation. Eat more high-fiber foods, such as fresh vegetables or fruit and whole grains. Drink plenty of fluids to keep your urine clear or pale yellow.  Take warm sitz baths to soothe any pain or discomfort caused by hemorrhoids. Use hemorrhoid cream if your caregiver approves.  If you develop varicose veins, wear support hose. Elevate your feet for 15 minutes, 3-4 times a day. Limit salt in your diet.  Avoid heavy lifting, wear low heal shoes, and practice good posture.  Rest a lot with your legs elevated if you have leg cramps or low back pain.  Visit your dentist if you have not gone during your pregnancy. Use a soft toothbrush to brush your teeth and be gentle when you floss.  A sexual relationship may be continued unless your caregiver directs you otherwise.  Do not travel far distances unless it is absolutely necessary and only with the approval  of your caregiver.  Take prenatal classes to understand, practice, and ask questions about the labor and delivery.  Make a trial run to the hospital.  Pack your hospital bag.  Prepare the baby's nursery.  Continue to go to all your prenatal visits as directed by your caregiver. SEEK MEDICAL CARE IF:  You are unsure if you are in labor or if your water has broken.  You have dizziness.  You have mild pelvic cramps, pelvic pressure, or nagging pain in your abdominal area.  You have persistent nausea, vomiting, or diarrhea.  You have a bad smelling vaginal discharge.  You have pain with urination. SEEK IMMEDIATE MEDICAL CARE IF:   You have a fever.  You are leaking fluid from your vagina.  You have spotting or bleeding from your vagina.  You have severe abdominal cramping or pain.  You have rapid weight loss or gain.  You have shortness of breath with chest pain.  You notice sudden or extreme swelling   of your face, hands, ankles, feet, or legs.  You have not felt your baby move in over an hour.  You have severe headaches that do not go away with medicine.  You have vision changes. Document Released: 11/09/2001 Document Revised: 11/20/2013 Document Reviewed: 01/16/2013 ExitCare Patient Information 2015 [employer]. This information is not intended to replace advice given to you by your health care provider. Make sure you discuss any questions you have with your health care provider.  

## 2015-02-06 NOTE — Progress Notes (Signed)
NST reactive, AFI 24, US at 38 weeks

## 2015-02-06 NOTE — Progress Notes (Signed)
Pt reminded of U/S 02/13/15 @ 1015a.

## 2015-02-11 ENCOUNTER — Telehealth (HOSPITAL_COMMUNITY): Payer: Self-pay | Admitting: *Deleted

## 2015-02-11 ENCOUNTER — Ambulatory Visit (INDEPENDENT_AMBULATORY_CARE_PROVIDER_SITE_OTHER): Payer: BLUE CROSS/BLUE SHIELD | Admitting: *Deleted

## 2015-02-11 VITALS — BP 133/75 | HR 74

## 2015-02-11 DIAGNOSIS — O403XX1 Polyhydramnios, third trimester, fetus 1: Secondary | ICD-10-CM | POA: Diagnosis not present

## 2015-02-11 NOTE — Telephone Encounter (Signed)
Preadmission screen  

## 2015-02-11 NOTE — Progress Notes (Addendum)
Pt denies H/A or visual disturbances. Reactive NST

## 2015-02-13 ENCOUNTER — Encounter (INDEPENDENT_AMBULATORY_CARE_PROVIDER_SITE_OTHER): Payer: BLUE CROSS/BLUE SHIELD | Admitting: Obstetrics and Gynecology

## 2015-02-13 ENCOUNTER — Ambulatory Visit (INDEPENDENT_AMBULATORY_CARE_PROVIDER_SITE_OTHER): Payer: BLUE CROSS/BLUE SHIELD | Admitting: Family

## 2015-02-13 ENCOUNTER — Ambulatory Visit (HOSPITAL_COMMUNITY)
Admission: RE | Admit: 2015-02-13 | Discharge: 2015-02-13 | Disposition: A | Discharge status: Home / Self Care | Payer: BLUE CROSS/BLUE SHIELD | Source: Ambulatory Visit | Attending: Obstetrics & Gynecology | Admitting: Obstetrics & Gynecology

## 2015-02-13 VITALS — BP 132/79 | HR 56 | Wt 287.0 lb

## 2015-02-13 VITALS — BP 132/79 | HR 56 | Wt 287.1 lb

## 2015-02-13 DIAGNOSIS — Z36 Encounter for antenatal screening of mother: Secondary | ICD-10-CM | POA: Insufficient documentation

## 2015-02-13 DIAGNOSIS — O99213 Obesity complicating pregnancy, third trimester: Secondary | ICD-10-CM | POA: Diagnosis not present

## 2015-02-13 DIAGNOSIS — O99210 Obesity complicating pregnancy, unspecified trimester: Secondary | ICD-10-CM | POA: Insufficient documentation

## 2015-02-13 DIAGNOSIS — O403XX1 Polyhydramnios, third trimester, fetus 1: Secondary | ICD-10-CM

## 2015-02-13 DIAGNOSIS — O403XX0 Polyhydramnios, third trimester, not applicable or unspecified: Secondary | ICD-10-CM

## 2015-02-13 DIAGNOSIS — Z3A38 38 weeks gestation of pregnancy: Secondary | ICD-10-CM | POA: Insufficient documentation

## 2015-02-13 DIAGNOSIS — O0933 Supervision of pregnancy with insufficient antenatal care, third trimester: Secondary | ICD-10-CM

## 2015-02-13 DIAGNOSIS — Z3A36 36 weeks gestation of pregnancy: Secondary | ICD-10-CM | POA: Diagnosis not present

## 2015-02-13 LAB — POCT URINALYSIS DIP (DEVICE)
BILIRUBIN URINE: NEGATIVE
Glucose, UA: NEGATIVE mg/dL
Ketones, ur: NEGATIVE mg/dL
Nitrite: NEGATIVE
Protein, ur: NEGATIVE mg/dL
Specific Gravity, Urine: 1.015 (ref 1.005–1.030)
Urobilinogen, UA: 0.2 mg/dL (ref 0.0–1.0)
pH: 6.5 (ref 5.0–8.0)

## 2015-02-13 NOTE — Progress Notes (Signed)
Doing well, not feeling contractions.  NST-reactive.  Ultrasound today for growth and AFI.  IOL scheduled for Sunday at 7 pm.

## 2015-02-16 ENCOUNTER — Encounter (HOSPITAL_COMMUNITY): Payer: Self-pay

## 2015-02-16 ENCOUNTER — Inpatient Hospital Stay (HOSPITAL_COMMUNITY)
Admission: RE | Admit: 2015-02-16 | Discharge: 2015-02-16 | DRG: 782 | Disposition: A | Discharge status: Home / Self Care | Payer: BLUE CROSS/BLUE SHIELD | Source: Ambulatory Visit | Attending: Obstetrics and Gynecology | Admitting: Obstetrics and Gynecology

## 2015-02-16 DIAGNOSIS — O403XX0 Polyhydramnios, third trimester, not applicable or unspecified: Secondary | ICD-10-CM | POA: Diagnosis present

## 2015-02-16 DIAGNOSIS — Z3A39 39 weeks gestation of pregnancy: Secondary | ICD-10-CM | POA: Diagnosis present

## 2015-02-16 MED ORDER — LACTATED RINGERS IV SOLN: INTRAVENOUS | Status: DC

## 2015-02-16 MED ORDER — ONDANSETRON HCL 4 MG/2ML IJ SOLN: 4.0000 mg | Freq: Four times a day (QID) | INTRAMUSCULAR | Status: DC | PRN

## 2015-02-16 MED ORDER — ACETAMINOPHEN 325 MG PO TABS: 650.0000 mg | ORAL_TABLET | ORAL | Status: DC | PRN

## 2015-02-16 MED ORDER — OXYTOCIN BOLUS FROM INFUSION: 500.0000 mL | INTRAVENOUS | Status: DC

## 2015-02-16 MED ORDER — OXYTOCIN 40 UNITS IN LACTATED RINGERS INFUSION - SIMPLE MED: 62.5000 mL/h | INTRAVENOUS | Status: DC

## 2015-02-16 MED ORDER — LACTATED RINGERS IV SOLN: 500.0000 mL | INTRAVENOUS | Status: DC | PRN

## 2015-02-16 MED ORDER — LIDOCAINE HCL (PF) 1 % IJ SOLN: 30.0000 mL | INTRAMUSCULAR | Status: DC | PRN

## 2015-02-16 MED ORDER — OXYCODONE-ACETAMINOPHEN 5-325 MG PO TABS
1.0000 | ORAL_TABLET | ORAL | Status: DC | PRN
Start: 2015-02-16 — End: 2015-02-16

## 2015-02-16 MED ORDER — CITRIC ACID-SODIUM CITRATE 334-500 MG/5ML PO SOLN: 30.0000 mL | ORAL | Status: DC | PRN

## 2015-02-16 MED ORDER — OXYCODONE-ACETAMINOPHEN 5-325 MG PO TABS: 2.0000 | ORAL_TABLET | ORAL | Status: DC | PRN

## 2015-02-16 NOTE — H&P (Signed)
Mindy Davies is a 29 y.o. female G1P0 at 5539 weeks GA presenting for scheduled induction of labor secondary to polyhydramnios. [facility], with an otherwise uncomplicated antepartum course History OB History    Gravida Para Term Preterm AB TAB SAB Ectopic Multiple Living   1 0 0 0 0 0 0 0 0 0      Past Medical History  Diagnosis Date  . Medical history non-contributory    Past Surgical History  Procedure Laterality Date  . Wisdom tooth extraction    . Bunionectomy     Family History: family history includes Cancer in her paternal grandmother. Social History:  reports that she has never smoked. She has never used smokeless tobacco. She reports that she does not drink alcohol or use illicit drugs.   Prenatal Transfer Tool  Maternal Diabetes: No Genetic Screening: Declined Maternal Ultrasounds/Referrals: Normal Fetal Ultrasounds or other Referrals:  None Maternal Substance Abuse:  No Significant Maternal Medications:  None Significant Maternal Lab Results:  None Other Comments:  None  ROS  Dilation: 1.5 Effacement (%): 70 Station: Ballotable Exam by:: Ross Blood pressure 129/83, pulse 71, temperature 97.8 F (36.6 C), temperature source Oral, resp. rate 18, height 5\' 7"  (1.702 m), weight 283 lb (128.368 kg), last menstrual period 03/11/2014. Exam Physical Exam  GENERAL: Well-developed, well-nourished female in no acute distress.  LUNGS: Clear to auscultation bilaterally.  HEART: Regular rate and rhythm. ABDOMEN: Soft, nontender, gravid PELVIC: see above EXTREMITIES: No cyanosis, clubbing, or edema, 2+ distal pulses.  Prenatal labs: ABO, Rh: --/--/AB POS (01/21 2120) Antibody: NEG (01/26 1634) Rubella: 1.65 (01/21 2120) RPR: NON REAC (01/26 1634)  HBsAg: NEGATIVE (01/21 2120)  HIV: NONREACTIVE (05/14 0105)  GBS: Positive (03/03 0000)   Assessment/Plan: 29 yo G1P0 at 39 weeks - Upon review of records, patient had an  ultrasound on 3/17 demonstrating a normal AFI - Given resolution of polyhydramnios and unfavorable cervix, patient was counseled on delaying induction of labor until postdates as to decrease the risks of cesarean section. - patient denies a history of leakage of fluid - Patient will be scheduled for a follow up routine ob visit this week   Mindy Davies 02/16/2015, 9:19 AM

## 2015-02-16 NOTE — Progress Notes (Signed)
S: Mindy Davies is a 29 yo G1P0 at 4831w0d scheduled for IOL due to polyhydramnios.  O:  Most recent US showed AFI WNL.  SVE 1/70/ballotable.  FHR 135 with moderate variability.  Not yet reactive, but no decels noted.  Occasional, mild contractions. A: G1 with IUP at 2631w0d AFI WNL Bishop score 6 Category 1 FHR P: Discussed risks/benefits/alternatives of IOL with unfavorable cervix at 39 weeks, & patient agreeable to going home, waiting for spontaneous labor. IV fluid bolus since IV already in place, and discharge to home when FHR reactive. Reviewed fetal movement, s/s labor & when to call.  Patient to call clinic tomorrow to schedule next appointment.

## 2015-02-16 NOTE — Discharge Instructions (Signed)
Braxton Hicks Contractions °Contractions of the uterus can occur throughout pregnancy. Contractions are not always a sign that you are in labor.  °WHAT ARE BRAXTON HICKS CONTRACTIONS?  °Contractions that occur before labor are called Braxton Hicks contractions, or false labor. Toward the end of pregnancy (32-34 weeks), these contractions can develop more often and may become more forceful. This is not true labor because these contractions do not result in opening (dilatation) and thinning of the cervix. They are sometimes difficult to tell apart from true labor because these contractions can be forceful and people have different pain tolerances. You should not feel embarrassed if you go to the hospital with false labor. Sometimes, the only way to tell if you are in true labor is for your health care provider to look for changes in the cervix. °If there are no prenatal problems or other health problems associated with the pregnancy, it is completely safe to be sent home with false labor and await the onset of true labor. °HOW CAN YOU TELL THE DIFFERENCE BETWEEN TRUE AND FALSE LABOR? °False Labor °· The contractions of false labor are usually shorter and not as hard as those of true labor.   °· The contractions are usually irregular.   °· The contractions are often felt in the front of the lower abdomen and in the groin.   °· The contractions may go away when you walk around or change positions while lying down.   °· The contractions get weaker and are shorter lasting as time goes on.   °· The contractions do not usually become progressively stronger, regular, and closer together as with true labor.   °True Labor °· Contractions in true labor last 30-70 seconds, become very regular, usually become more intense, and increase in frequency.   °· The contractions do not go away with walking.   °· The discomfort is usually felt in the top of the uterus and spreads to the lower abdomen and low back.   °· True labor can be  determined by your health care provider with an exam. This will show that the cervix is dilating and getting thinner.   °WHAT TO REMEMBER °· Keep up with your usual exercises and follow other instructions given by your health care provider.   °· Take medicines as directed by your health care provider.   °· Keep your regular prenatal appointments.   °· Eat and drink lightly if you think you are going into labor.   °· If Braxton Hicks contractions are making you uncomfortable:   °¨ Change your position from lying down or resting to walking, or from walking to resting.   °¨ Sit and rest in a tub of warm water.   °¨ Drink 2-3 glasses of water. Dehydration may cause these contractions.   °¨ Do slow and deep breathing several times an hour.   °WHEN SHOULD I SEEK IMMEDIATE MEDICAL CARE? °Seek immediate medical care if: °· Your contractions become stronger, more regular, and closer together.   °· You have fluid leaking or gushing from your vagina.   °· You have a fever.   °· You pass blood-tinged mucus.   °· You have vaginal bleeding.   °· You have continuous abdominal pain.   °· You have low back pain that you never had before.   °· You feel your baby's head pushing down and causing pelvic pressure.   °· Your baby is not moving as much as it used to.   °Document Released: 11/15/2005 Document Revised: 11/20/2013 Document Reviewed: 08/27/2013 °ExitCare® Patient Information ©2015 [employer]. This information is not intended to replace advice given to you by your health care   provider. Make sure you discuss any questions you have with your health care provider. ° °

## 2015-02-16 NOTE — Progress Notes (Signed)
Pt DC'd home per MD order, reinforced dc instructions. Pt will call tomorrow and make appointment with clinic for this week. Pt verbalizes understanding of dc instructions and floow up.

## 2015-02-17 NOTE — Discharge Summary (Signed)
Physician Discharge Summary  Patient ID: Mindy Davies MRN: [id number] DOB/AGE: September 04, 1986 29 y.o.  Admit date: 02/16/2015 Discharge date: 02/17/2015  Admission Diagnoses: polyhydramnios  Discharge Diagnoses: resolution of polyhydramnios Active Problems:   Polyhydramnios in third trimester, antepartum   Discharged Condition: good  Hospital Course: Patient admitted for induction of labor at 39 weeks secondary to polyhydramnios. Upon review of prenatal records, patient had a normal AFI on 3/17. She had a normal NST while on birthing suites and denies a history of rupture of membranes. Patient had an unfavorable cervix and was discharge home to await spontaneous onset of labor or postdate induction. Patient will follow up this week for routine prenatal care. Discharge instructions were provided  Consults: None  Treatments: none  Discharge Exam: Blood pressure 128/77, pulse 70, temperature 98.7 F (37.1 C), temperature source Oral, resp. rate 20, height 5\' 7"  (1.702 m), weight 283 lb (128.368 kg), last menstrual period 03/11/2014. General appearance: alert, cooperative and no distress Resp: clear to auscultation bilaterally Cardio: regular rate and rhythm GI: soft, gravid, NT Extremities: extremities normal, atraumatic, no cyanosis or edema and Homans sign is negative, no sign of DVT  NST: baseline 130, mod variability, +accels, no decels Toco: irregular contractions  Disposition: 81-Discharged to home/self-care with a planned acute care hospital inpt readmission     Medication List    ASK your doctor about these medications        calcium carbonate 500 MG chewable tablet  Commonly known as:  TUMS - dosed in mg elemental calcium  Chew 3 tablets by mouth at bedtime as needed for indigestion or heartburn.     famotidine 20 MG tablet  Commonly known as:  PEPCID  Take 1 tablet (20 mg total) by mouth 2 (two) times daily.     prenatal vitamin w/FE, FA 27-1 MG Tabs tablet   Take 1 tablet by mouth daily at 12 noon.           Follow-up Information    Follow up with Nyeisha Goodall, MD.   Specialty:  Obstetrics and Gynecology   Why:  KEEP SCHEDULED APPOINTMENT, CALL WITH ANY QUESTIONS OR CONCERNS    Contact information:   [address] [place] [address] [phone]       Signed: Andrius Andrepont 02/17/2015, 7:00 AM

## 2015-02-19 ENCOUNTER — Inpatient Hospital Stay (HOSPITAL_COMMUNITY)
Admission: AD | Admit: 2015-02-19 | Discharge: 2015-02-20 | Disposition: A | Discharge status: Home / Self Care | Payer: BLUE CROSS/BLUE SHIELD | Source: Ambulatory Visit | Attending: Obstetrics and Gynecology | Admitting: Obstetrics and Gynecology

## 2015-02-19 ENCOUNTER — Ambulatory Visit (INDEPENDENT_AMBULATORY_CARE_PROVIDER_SITE_OTHER): Payer: BLUE CROSS/BLUE SHIELD | Admitting: Advanced Practice Midwife

## 2015-02-19 ENCOUNTER — Encounter (HOSPITAL_COMMUNITY): Payer: Self-pay | Admitting: *Deleted

## 2015-02-19 VITALS — BP 125/66 | HR 54 | Temp 98.3°F | Wt 295.6 lb

## 2015-02-19 DIAGNOSIS — O0933 Supervision of pregnancy with insufficient antenatal care, third trimester: Secondary | ICD-10-CM

## 2015-02-19 DIAGNOSIS — Z3A39 39 weeks gestation of pregnancy: Secondary | ICD-10-CM | POA: Diagnosis not present

## 2015-02-19 LAB — POCT URINALYSIS DIP (DEVICE)
Bilirubin Urine: NEGATIVE
Glucose, UA: NEGATIVE mg/dL
Ketones, ur: NEGATIVE mg/dL
Nitrite: NEGATIVE
Protein, ur: NEGATIVE mg/dL
SPECIFIC GRAVITY, URINE: 1.015 (ref 1.005–1.030)
Urobilinogen, UA: 0.2 mg/dL (ref 0.0–1.0)
pH: 6.5 (ref 5.0–8.0)

## 2015-02-19 NOTE — Progress Notes (Signed)
39+3wks, no complaints today. Was scheduled for induction on Sunday due to polyhydramnios, however review of U/S on 3/17 revealed a normal AFI of 14.4cm Pt endorses contractions q4-6 minutes lasting 2-3 minutes (states she continues to be able to function through contractions). Good fetal movement, no vaginal bleeding or LOF.

## 2015-02-19 NOTE — MAU Note (Signed)
Having contractions since Sunday, getting worse.  Was 3.5 cm in office today.  Baby not moving well since 5 pm.  No bleeding.  No leaking.

## 2015-02-19 NOTE — Patient Instructions (Addendum)

## 2015-02-19 NOTE — Progress Notes (Signed)
I have seen this patient today and agree Dr Derek Moundorsey's note.  With normal AFI, no IOL or testing needed at this time.  Labor precautions/reasons to come to MAU given.  LEFTWICH-KIRBY, Tywana Robotham Certified Nurse-Midwife

## 2015-02-19 NOTE — Progress Notes (Signed)
Pt desires cervical exam today.  Pt states she was supposed to be induced on this past Sunday, but provider stated her fluid levels were ok and she did not need to be induced.

## 2015-02-20 LAB — URINALYSIS, ROUTINE W REFLEX MICROSCOPIC
Bilirubin Urine: NEGATIVE
Glucose, UA: NEGATIVE mg/dL
Ketones, ur: NEGATIVE mg/dL
Nitrite: NEGATIVE
PROTEIN: NEGATIVE mg/dL
Specific Gravity, Urine: 1.01 (ref 1.005–1.030)
UROBILINOGEN UA: 0.2 mg/dL (ref 0.0–1.0)
pH: 6.5 (ref 5.0–8.0)

## 2015-02-20 LAB — URINE MICROSCOPIC-ADD ON

## 2015-02-20 NOTE — Discharge Instructions (Signed)
Braxton Hicks Contractions °Contractions of the uterus can occur throughout pregnancy. Contractions are not always a sign that you are in labor.  °WHAT ARE BRAXTON HICKS CONTRACTIONS?  °Contractions that occur before labor are called Braxton Hicks contractions, or false labor. Toward the end of pregnancy (32-34 weeks), these contractions can develop more often and may become more forceful. This is not true labor because these contractions do not result in opening (dilatation) and thinning of the cervix. They are sometimes difficult to tell apart from true labor because these contractions can be forceful and people have different pain tolerances. You should not feel embarrassed if you go to the hospital with false labor. Sometimes, the only way to tell if you are in true labor is for your health care provider to look for changes in the cervix. °If there are no prenatal problems or other health problems associated with the pregnancy, it is completely safe to be sent home with false labor and await the onset of true labor. °HOW CAN YOU TELL THE DIFFERENCE BETWEEN TRUE AND FALSE LABOR? °False Labor °· The contractions of false labor are usually shorter and not as hard as those of true labor.   °· The contractions are usually irregular.   °· The contractions are often felt in the front of the lower abdomen and in the groin.   °· The contractions may go away when you walk around or change positions while lying down.   °· The contractions get weaker and are shorter lasting as time goes on.   °· The contractions do not usually become progressively stronger, regular, and closer together as with true labor.   °True Labor °· Contractions in true labor last 30-70 seconds, become very regular, usually become more intense, and increase in frequency.   °· The contractions do not go away with walking.   °· The discomfort is usually felt in the top of the uterus and spreads to the lower abdomen and low back.   °· True labor can be  determined by your health care provider with an exam. This will show that the cervix is dilating and getting thinner.   °WHAT TO REMEMBER °· Keep up with your usual exercises and follow other instructions given by your health care provider.   °· Take medicines as directed by your health care provider.   °· Keep your regular prenatal appointments.   °· Eat and drink lightly if you think you are going into labor.   °· If Braxton Hicks contractions are making you uncomfortable:   °¨ Change your position from lying down or resting to walking, or from walking to resting.   °¨ Sit and rest in a tub of warm water.   °¨ Drink 2-3 glasses of water. Dehydration may cause these contractions.   °¨ Do slow and deep breathing several times an hour.   °WHEN SHOULD I SEEK IMMEDIATE MEDICAL CARE? °Seek immediate medical care if: °· Your contractions become stronger, more regular, and closer together.   °· You have fluid leaking or gushing from your vagina.   °· You have a fever.   °· You pass blood-tinged mucus.   °· You have vaginal bleeding.   °· You have continuous abdominal pain.   °· You have low back pain that you never had before.   °· You feel your baby's head pushing down and causing pelvic pressure.   °· Your baby is not moving as much as it used to.   °Document Released: 11/15/2005 Document Revised: 11/20/2013 Document Reviewed: 08/27/2013 °ExitCare® Patient Information ©2015 [employer]. This information is not intended to replace advice given to you by your health care   provider. Make sure you discuss any questions you have with your health care provider. ° °

## 2015-02-20 NOTE — MAU Note (Signed)
Pt to be d/c to home with labor discharge instructions.

## 2015-02-22 ENCOUNTER — Inpatient Hospital Stay (HOSPITAL_COMMUNITY): Payer: BLUE CROSS/BLUE SHIELD

## 2015-02-22 ENCOUNTER — Inpatient Hospital Stay (HOSPITAL_COMMUNITY)
Admission: AD | Admit: 2015-02-22 | Discharge: 2015-02-22 | Disposition: A | Discharge status: Home / Self Care | Payer: BLUE CROSS/BLUE SHIELD | Source: Ambulatory Visit | Attending: Obstetrics & Gynecology | Admitting: Obstetrics & Gynecology

## 2015-02-22 ENCOUNTER — Encounter (HOSPITAL_COMMUNITY): Payer: Self-pay | Admitting: *Deleted

## 2015-02-22 DIAGNOSIS — O26893 Other specified pregnancy related conditions, third trimester: Secondary | ICD-10-CM

## 2015-02-22 DIAGNOSIS — M25559 Pain in unspecified hip: Secondary | ICD-10-CM | POA: Diagnosis not present

## 2015-02-22 DIAGNOSIS — Z3A39 39 weeks gestation of pregnancy: Secondary | ICD-10-CM | POA: Diagnosis not present

## 2015-02-22 DIAGNOSIS — O368131 Decreased fetal movements, third trimester, fetus 1: Secondary | ICD-10-CM

## 2015-02-22 DIAGNOSIS — O368190 Decreased fetal movements, unspecified trimester, not applicable or unspecified: Secondary | ICD-10-CM

## 2015-02-22 DIAGNOSIS — R102 Pelvic and perineal pain: Secondary | ICD-10-CM | POA: Diagnosis not present

## 2015-02-22 DIAGNOSIS — O368130 Decreased fetal movements, third trimester, not applicable or unspecified: Secondary | ICD-10-CM | POA: Insufficient documentation

## 2015-02-22 DIAGNOSIS — Z3A38 38 weeks gestation of pregnancy: Secondary | ICD-10-CM

## 2015-02-22 LAB — URINALYSIS, ROUTINE W REFLEX MICROSCOPIC
BILIRUBIN URINE: NEGATIVE
GLUCOSE, UA: NEGATIVE mg/dL
KETONES UR: NEGATIVE mg/dL
NITRITE: NEGATIVE
Protein, ur: NEGATIVE mg/dL
Specific Gravity, Urine: 1.02 (ref 1.005–1.030)
Urobilinogen, UA: 0.2 mg/dL (ref 0.0–1.0)
pH: 7 (ref 5.0–8.0)

## 2015-02-22 LAB — COMPREHENSIVE METABOLIC PANEL
ALT: 18 U/L (ref 0–35)
AST: 18 U/L (ref 0–37)
Albumin: 2.4 g/dL — ABNORMAL LOW (ref 3.5–5.2)
Alkaline Phosphatase: 163 U/L — ABNORMAL HIGH (ref 39–117)
Anion gap: 7 (ref 5–15)
BUN: 9 mg/dL (ref 6–23)
CHLORIDE: 106 mmol/L (ref 96–112)
CO2: 25 mmol/L (ref 19–32)
CREATININE: 0.79 mg/dL (ref 0.50–1.10)
Calcium: 11.4 mg/dL — ABNORMAL HIGH (ref 8.4–10.5)
GFR calc non Af Amer: >90 mL/min (ref >90)
Glucose, Bld: 80 mg/dL (ref 70–99)
POTASSIUM: 4.4 mmol/L (ref 3.5–5.1)
SODIUM: 138 mmol/L (ref 135–145)
Total Bilirubin: 0.2 mg/dL — ABNORMAL LOW (ref 0.3–1.2)
Total Protein: 5.7 g/dL — ABNORMAL LOW (ref 6.0–8.3)

## 2015-02-22 LAB — CBC
HCT: 29.5 % — ABNORMAL LOW (ref 36.0–46.0)
Hemoglobin: 10.1 g/dL — ABNORMAL LOW (ref 12.0–15.0)
MCH: 31.8 pg (ref 26.0–34.0)
MCHC: 34.2 g/dL (ref 30.0–36.0)
MCV: 92.8 fL (ref 78.0–100.0)
Platelets: 205 10*3/uL (ref 150–400)
RBC: 3.18 MIL/uL — AB (ref 3.87–5.11)
RDW: 13.3 % (ref 11.5–15.5)
WBC: 11 10*3/uL — ABNORMAL HIGH (ref 4.0–10.5)

## 2015-02-22 LAB — URINE MICROSCOPIC-ADD ON

## 2015-02-22 LAB — PROTEIN / CREATININE RATIO, URINE
Creatinine, Urine: 176 mg/dL
Protein Creatinine Ratio: 0.15 (ref 0.00–0.15)
Total Protein, Urine: 26 mg/dL

## 2015-02-22 LAB — POCT FERN TEST

## 2015-02-22 MED ORDER — CYCLOBENZAPRINE HCL 10 MG PO TABS
10.0000 mg | ORAL_TABLET | Freq: Once | ORAL | Status: AC
  Administered 2015-02-22: 10 mg via ORAL
  Filled 2015-02-22: qty 1

## 2015-02-22 NOTE — MAU Note (Signed)
Pt. States she is here due to decreased fetal movement and leaking fluid that began this week. Pt. States was last seen by OB on Wednesday and was 3.5cm. Pt. Also contracting and feels they are consistent and does not feel that they are going away. Denies them being closer together or stronger than earlier this week. Next OB appointment is this coming Wednesday.

## 2015-02-22 NOTE — MAU Provider Note (Signed)
Chief Complaint:  Hip Pain and Contractions   First Provider Initiated Contact with Patient 02/22/15 1253      HPI: Mindy Davies is a 29 y.o. G1P0000 at [redacted]w[redacted]d who presents to maternity admissions reporting uncomfortable contractions, pain in her hip that is constant, and decreased fetal movement.  She also reports leakage of fluid x 1 week. Contractions are irregular but painful, with onset today, her hip pain started weeks ago but has worsened in the last week, making it hard for her to walk or get out of bed.  She describes the hip pain as sharp, occuring more with movement, and causing a click or pop when she gets out of bed.  She has not felt fetal movement today, and reports she has felt less all week.  She denies vaginal bleeding, vaginal itching/burning, urinary symptoms, h/a, dizziness, n/v, or fever/chills.      Past Medical History: Past Medical History  Diagnosis Date  . Medical history non-contributory     Past obstetric history: OB History  Gravida Para Term Preterm AB SAB TAB Ectopic Multiple Living  0 0 0 0 0 0 1    # Outcome Date GA Lbr Len/2nd Weight Sex Delivery Anes PTL Lv  1 Term 02/25/15 [redacted]w[redacted]d 09:14 / 01:44 3.229 kg (7 lb 1.9 oz) F Vag-Spont EPI  Y      Past Surgical History: Past Surgical History  Procedure Laterality Date  . Wisdom tooth extraction    . Bunionectomy      Family History: Family History  Problem Relation Age of Onset  . Cancer Paternal Grandmother     Social History: History  Substance Use Topics  . Smoking status: Never Smoker   . Smokeless tobacco: Never Used  . Alcohol Use: No    Allergies: No Known Allergies  Meds:  Prescriptions prior to admission  Medication Sig Dispense Refill Last Dose  . famotidine (PEPCID) 20 MG tablet Take 1 tablet (20 mg total) by mouth 2 (two) times daily. (Patient taking differently: Take 20 mg by mouth 2 (two) times daily as needed. ) 60 tablet 1 Past Week at Unknown time  . prenatal  vitamin w/FE, FA (PRENATAL 1 + 1) 27-1 MG TABS tablet Take 1 tablet by mouth daily.    02/24/2015 at Unknown time    ROS: Pertinent findings in history of present illness.  Physical Exam  Blood pressure 129/76, pulse 52, temperature 98.4 F (36.9 C), temperature source Oral, resp. rate 16, height  (1.702 m), weight 133.414 kg (294 lb 2 oz), last menstrual period 03/11/2014, unknown if currently breastfeeding.  No data found.  GENERAL: Well-developed, well-nourished female in no acute distress.  HEENT: normocephalic HEART: normal rate RESP: normal effort ABDOMEN: Soft, non-tender, gravid appropriate for gestational age EXTREMITIES: Nontender, no edema NEURO: alert and oriented   Dilation: 3 Effacement (%): 70 Cervical Position: Middle Station: -3 Presentation: Vertex Exam by:: Sarajane Marek, RN    FHT:  Baseline 135, moderate variability, accelerations present, no decelerations Contractions: rare on toco, mild to palpation   Labs: No results found for this or any previous visit (from the past 24 hour(s)).  Imaging:   ED Course Flexeril 10 mg PO given in MAU with some relief of hip pain.  Discussed pelvic girdle pain with pt, causes, likely resolution after pregnancy.    Pt reported some fetal movement in MAU but continues to report it is less than usual.  10/10 BPP plus NST.  AFI 13.35.  Assessment: 1. Decreased fetal movement, third trimester, fetus 1   2. Decreased fetal movement   3. [redacted] weeks gestation of pregnancy   4. Pelvic pain affecting pregnancy in third trimester, antepartum     Plan: Consult Dr Erin FullingHarraway Smith Discharge home Reassurance provided, offered Rx for Flexeril but pt declined Labor precautions and fetal kick counts F/U as scheduled in clinic    Medication List    ASK your doctor about these medications        famotidine 20 MG tablet  Commonly known as:  PEPCID  Take 1 tablet (20 mg total) by mouth 2 (two) times daily.      prenatal vitamin w/FE, FA 27-1 MG Tabs tablet  Take 1 tablet by mouth daily.        Sharen CounterLisa Leftwich-Kirby Certified Nurse-Midwife 02/27/2015 8:10 AM

## 2015-02-22 NOTE — Discharge Instructions (Signed)

## 2015-02-24 DIAGNOSIS — Z3A39 39 weeks gestation of pregnancy: Secondary | ICD-10-CM | POA: Insufficient documentation

## 2015-02-24 DIAGNOSIS — O368190 Decreased fetal movements, unspecified trimester, not applicable or unspecified: Secondary | ICD-10-CM | POA: Insufficient documentation

## 2015-02-25 ENCOUNTER — Encounter (HOSPITAL_COMMUNITY): Payer: Self-pay | Admitting: *Deleted

## 2015-02-25 ENCOUNTER — Inpatient Hospital Stay (HOSPITAL_COMMUNITY)
Admission: AD | Admit: 2015-02-25 | Discharge: 2015-02-27 | DRG: 775 | Disposition: A | Discharge status: Home / Self Care | Payer: BLUE CROSS/BLUE SHIELD | Source: Ambulatory Visit | Attending: Family Medicine | Admitting: Family Medicine

## 2015-02-25 ENCOUNTER — Inpatient Hospital Stay (HOSPITAL_COMMUNITY): Payer: BLUE CROSS/BLUE SHIELD | Admitting: Anesthesiology

## 2015-02-25 DIAGNOSIS — Z3A40 40 weeks gestation of pregnancy: Secondary | ICD-10-CM | POA: Diagnosis present

## 2015-02-25 DIAGNOSIS — Z3403 Encounter for supervision of normal first pregnancy, third trimester: Secondary | ICD-10-CM | POA: Diagnosis present

## 2015-02-25 DIAGNOSIS — O99824 Streptococcus B carrier state complicating childbirth: Secondary | ICD-10-CM | POA: Diagnosis present

## 2015-02-25 DIAGNOSIS — O480 Post-term pregnancy: Principal | ICD-10-CM | POA: Diagnosis present

## 2015-02-25 DIAGNOSIS — IMO0001 Reserved for inherently not codable concepts without codable children: Secondary | ICD-10-CM

## 2015-02-25 LAB — CBC
HCT: 31.5 % — ABNORMAL LOW (ref 36.0–46.0)
HEMOGLOBIN: 10.6 g/dL — AB (ref 12.0–15.0)
MCH: 31.4 pg (ref 26.0–34.0)
MCHC: 33.7 g/dL (ref 30.0–36.0)
MCV: 93.2 fL (ref 78.0–100.0)
Platelets: 201 10*3/uL (ref 150–400)
RBC: 3.38 MIL/uL — ABNORMAL LOW (ref 3.87–5.11)
RDW: 13.1 % (ref 11.5–15.5)
WBC: 12.5 10*3/uL — ABNORMAL HIGH (ref 4.0–10.5)

## 2015-02-25 LAB — TYPE AND SCREEN
ABO/RH(D): AB POS
ANTIBODY SCREEN: NEGATIVE

## 2015-02-25 LAB — COMPREHENSIVE METABOLIC PANEL
ALK PHOS: 174 U/L — AB (ref 39–117)
ALT: 17 U/L (ref 0–35)
AST: 21 U/L (ref 0–37)
Albumin: 2.6 g/dL — ABNORMAL LOW (ref 3.5–5.2)
Anion gap: 9 (ref 5–15)
BILIRUBIN TOTAL: 0.3 mg/dL (ref 0.3–1.2)
BUN: 8 mg/dL (ref 6–23)
CO2: 21 mmol/L (ref 19–32)
Calcium: 10.7 mg/dL — ABNORMAL HIGH (ref 8.4–10.5)
Chloride: 107 mmol/L (ref 96–112)
Creatinine, Ser: 0.79 mg/dL (ref 0.50–1.10)
GFR calc Af Amer: >90 mL/min (ref >90)
GFR calc non Af Amer: >90 mL/min (ref >90)
Glucose, Bld: 82 mg/dL (ref 70–99)
POTASSIUM: 3.5 mmol/L (ref 3.5–5.1)
SODIUM: 137 mmol/L (ref 135–145)
TOTAL PROTEIN: 5.9 g/dL — AB (ref 6.0–8.3)

## 2015-02-25 LAB — PROTEIN / CREATININE RATIO, URINE
CREATININE, URINE: 139 mg/dL
Protein Creatinine Ratio: 0.21 — ABNORMAL HIGH (ref 0.00–0.15)
Total Protein, Urine: 29 mg/dL

## 2015-02-25 MED ORDER — PHENYLEPHRINE 40 MCG/ML (10ML) SYRINGE FOR IV PUSH (FOR BLOOD PRESSURE SUPPORT)
80.0000 ug | PREFILLED_SYRINGE | INTRAVENOUS | Status: DC | PRN
  Filled 2015-02-25: qty 2

## 2015-02-25 MED ORDER — FENTANYL 2.5 MCG/ML BUPIVACAINE 1/10 % EPIDURAL INFUSION (WH - ANES)
INTRAMUSCULAR | Status: DC | PRN
  Administered 2015-02-25: 14 mL/h via EPIDURAL

## 2015-02-25 MED ORDER — EPHEDRINE 5 MG/ML INJ
10.0000 mg | INTRAVENOUS | Status: DC | PRN
  Filled 2015-02-25: qty 2

## 2015-02-25 MED ORDER — FENTANYL CITRATE 0.05 MG/ML IJ SOLN
100.0000 ug | INTRAMUSCULAR | Status: DC | PRN
  Administered 2015-02-25: 100 ug via INTRAVENOUS
  Filled 2015-02-25: qty 2

## 2015-02-25 MED ORDER — LACTATED RINGERS IV SOLN: 500.0000 mL | INTRAVENOUS | Status: DC | PRN

## 2015-02-25 MED ORDER — OXYTOCIN 40 UNITS IN LACTATED RINGERS INFUSION - SIMPLE MED
62.5000 mL/h | INTRAVENOUS | Status: DC
  Filled 2015-02-25: qty 1000

## 2015-02-25 MED ORDER — LACTATED RINGERS IV SOLN: 500.0000 mL | Freq: Once | INTRAVENOUS | Status: DC

## 2015-02-25 MED ORDER — ACETAMINOPHEN 325 MG PO TABS: 650.0000 mg | ORAL_TABLET | ORAL | Status: DC | PRN

## 2015-02-25 MED ORDER — AMPICILLIN SODIUM 2 G IJ SOLR
2.0000 g | Freq: Once | INTRAMUSCULAR | Status: AC
  Administered 2015-02-25: 2 g via INTRAVENOUS
  Filled 2015-02-25: qty 2000

## 2015-02-25 MED ORDER — OXYCODONE-ACETAMINOPHEN 5-325 MG PO TABS: 2.0000 | ORAL_TABLET | ORAL | Status: DC | PRN

## 2015-02-25 MED ORDER — LIDOCAINE HCL (PF) 1 % IJ SOLN
INTRAMUSCULAR | Status: DC | PRN
  Administered 2015-02-25 (×2): 8 mL

## 2015-02-25 MED ORDER — LACTATED RINGERS IV SOLN: INTRAVENOUS | Status: DC

## 2015-02-25 MED ORDER — ONDANSETRON HCL 4 MG/2ML IJ SOLN
4.0000 mg | Freq: Four times a day (QID) | INTRAMUSCULAR | Status: DC | PRN
Start: 2015-02-25 — End: 2015-02-26

## 2015-02-25 MED ORDER — FENTANYL 2.5 MCG/ML BUPIVACAINE 1/10 % EPIDURAL INFUSION (WH - ANES)
14.0000 mL/h | INTRAMUSCULAR | Status: DC | PRN
  Administered 2015-02-25: 14 mL/h via EPIDURAL
  Filled 2015-02-25: qty 125

## 2015-02-25 MED ORDER — LIDOCAINE HCL (PF) 1 % IJ SOLN
30.0000 mL | INTRAMUSCULAR | Status: DC | PRN
  Filled 2015-02-25: qty 30

## 2015-02-25 MED ORDER — OXYTOCIN BOLUS FROM INFUSION
500.0000 mL | INTRAVENOUS | Status: DC
  Administered 2015-02-25: 500 mL via INTRAVENOUS

## 2015-02-25 MED ORDER — PHENYLEPHRINE 40 MCG/ML (10ML) SYRINGE FOR IV PUSH (FOR BLOOD PRESSURE SUPPORT)
80.0000 ug | PREFILLED_SYRINGE | INTRAVENOUS | Status: DC | PRN
  Filled 2015-02-25: qty 20
  Filled 2015-02-25: qty 2

## 2015-02-25 MED ORDER — CITRIC ACID-SODIUM CITRATE 334-500 MG/5ML PO SOLN: 30.0000 mL | ORAL | Status: DC | PRN

## 2015-02-25 MED ORDER — DIPHENHYDRAMINE HCL 50 MG/ML IJ SOLN: 12.5000 mg | INTRAMUSCULAR | Status: DC | PRN

## 2015-02-25 MED ORDER — OXYCODONE-ACETAMINOPHEN 5-325 MG PO TABS: 1.0000 | ORAL_TABLET | ORAL | Status: DC | PRN

## 2015-02-25 NOTE — Anesthesia Procedure Notes (Signed)
Epidural Patient location during procedure: OB Start time: 02/25/2015 12:24 PM End time: 02/25/2015 12:28 PM  Staffing Anesthesiologist: Leilani AbleHATCHETT, Eria Lozoya Performed by: anesthesiologist   Preanesthetic Checklist Completed: patient identified, surgical consent, pre-op evaluation, timeout performed, IV checked, risks and benefits discussed and monitors and equipment checked  Epidural Patient position: sitting Prep: site prepped and draped and DuraPrep Patient monitoring: continuous pulse ox and blood pressure Approach: midline Location: L3-L4 Injection technique: LOR air  Needle:  Needle type: Tuohy  Needle gauge: 17 G Needle length: 9 cm and 9 Needle insertion depth: 7 cm Catheter type: closed end flexible Catheter size: 19 Gauge Catheter at skin depth: 12 cm Test dose: negative and Other  Assessment Sensory level: T9 Events: blood not aspirated, injection not painful, no injection resistance, negative IV test and no paresthesia  Additional Notes Reason for block:procedure for pain

## 2015-02-25 NOTE — MAU Note (Signed)
Was started to get here, too uncomfortable, called EMS.  No leaking or bleeding.  Saline lock placed by EMS.  Feeling pressure

## 2015-02-25 NOTE — Anesthesia Preprocedure Evaluation (Signed)
Anesthesia Evaluation  Patient identified by MRN, date of birth, ID band Patient awake    Reviewed: Allergy & Precautions, H&P , Patient's Chart, lab work & pertinent test results  Airway Mallampati: II  TM Distance: >3 FB Neck ROM: full    Dental no notable dental hx.    Pulmonary neg pulmonary ROS,    Pulmonary exam normal       Cardiovascular negative cardio ROS      Neuro/Psych negative neurological ROS  negative psych ROS   GI/Hepatic negative GI ROS, Neg liver ROS,   Endo/Other  Morbid obesity  Renal/GU negative Renal ROS     Musculoskeletal   Abdominal Normal abdominal exam  (+)   Peds  Hematology negative hematology ROS (+)   Anesthesia Other Findings   Reproductive/Obstetrics (+) Pregnancy                             Anesthesia Physical Anesthesia Plan  ASA: III  Anesthesia Plan: Epidural   Post-op Pain Management:    Induction:   Airway Management Planned:   Additional Equipment:   Intra-op Plan:   Post-operative Plan:   Informed Consent: I have reviewed the patients History and Physical, chart, labs and discussed the procedure including the risks, benefits and alternatives for the proposed anesthesia with the patient or authorized representative who has indicated his/her understanding and acceptance.     Plan Discussed with:   Anesthesia Plan Comments:         Anesthesia Quick Evaluation

## 2015-02-25 NOTE — H&P (Signed)
LABOR ADMISSION HISTORY AND PHYSICAL  Peytan Mcquaig is a 29 y.o. female G1P0000 with IUP at [redacted]w[redacted]d by ultrasound presenting for onset of labor. She reports +FMs, No LOF, no VB, no blurry vision, headaches and RUQ pain.  She plans on bottle feeding. She request mirena for birth control.  Dating: By dating [redacted]w[redacted]d sono --->  Estimated Date of Delivery: 02/23/15   Prenatal History/Complications:  Past Medical History: Past Medical History  Diagnosis Date  . Medical history non-contributory     Past Surgical History: Past Surgical History  Procedure Laterality Date  . Wisdom tooth extraction    . Bunionectomy      Obstetrical History: OB History    Gravida Para Term Preterm AB TAB SAB Ectopic Multiple Living        Social History: History   Social History  . Marital Status: Single    Spouse Name: N/A  . Number of Children: N/A  . Years of Education: N/A   Social History Main Topics  . Smoking status: Never Smoker   . Smokeless tobacco: Never Used  . Alcohol Use: No  . Drug Use: No  . Sexual Activity: Yes    Birth Control/ Protection: None   Other Topics Concern  . None   Social History Narrative    Family History: Family History  Problem Relation Age of Onset  . Cancer Paternal Grandmother     Allergies: No Known Allergies  Prescriptions prior to admission  Medication Sig Dispense Refill Last Dose  . famotidine (PEPCID) 20 MG tablet Take 1 tablet (20 mg total) by mouth 2 (two) times daily. (Patient taking differently: Take 20 mg by mouth 2 (two) times daily as needed. ) 60 tablet 1 Past Week at Unknown time  . prenatal vitamin w/FE, FA (PRENATAL 1 + 1) 27-1 MG TABS tablet Take 1 tablet by mouth daily.    02/24/2015 at Unknown time     Review of Systems   All systems reviewed and negative except as stated in HPI  Blood pressure 136/79, pulse 60, temperature 98.5 F (36.9 C), temperature source Oral, resp. rate 20, height   (1.702 m), weight 294 lb 2 oz (133.414 kg), last menstrual period 03/11/2014, SpO2 100 %. General appearance: alert, cooperative and mild distress Lungs: clear to auscultation bilaterally Heart: regular rate and rhythm Abdomen: soft, non-tender; bowel sounds normal Pelvic: adequate Extremities: Homans sign is negative, no sign of DVT, 2+ pitting edema DTR's 2+ Presentation: cephalic Fetal monitoringBaseline: 135 bpm, Variability: Good {> 6 bpm), Accelerations: Reactive and Decelerations: Absent Uterine activityq2-44min  Dilation: 4 Effacement (%): 90 Station: -3 Exam by:: Bobbe Medico   Prenatal labs: ABO, Rh: --/--/AB POS (03/29 1111) Antibody: NEG (03/29 1111) Rubella:   RPR: NON REAC (01/26 1634)  HBsAg: NEGATIVE (01/21 2120)  HIV: NONREACTIVE (05/14 0105)  GBS: Positive (03/03 0000)  1 hr Glucola 76 Genetic screening  Too late Anatomy US polyhydramnios initially, resolved  Clinic  [facility]  Dating  third trim u/s  Genetic Screen  Too late  Anatomic [place] Polyhydramnios on 1/29, limited views of spine, repeat ordered > continued poor view  GTT Early:               Third trimester: 74  TDaP vaccine 12/24/14  Flu vaccine declined  GBS Positive   Contraception  mirena  Baby Food  breast  Circumcision  n/a  Pediatrician   Support Person  sister, Bartolo Darter  GC/CH negative 01/30/15   Results for orders placed or performed during the hospital encounter of 02/25/15 (from the past 24 hour(s))  CBC   Collection Time: 02/25/15 11:11 AM  Result Value Ref Range   WBC 12.5 (H) 4.0 - 10.5 K/uL   RBC 3.38 (L) 3.87 - 5.11 MIL/uL   Hemoglobin 10.6 (L) 12.0 - 15.0 g/dL   HCT 16.131.5 (L) 09.636.0 - 04.546.0 %   MCV 93.2 78.0 - 100.0 fL   MCH 31.4 26.0 - 34.0 pg   MCHC 33.7 30.0 - 36.0 g/dL   RDW 40.913.1 81.111.5 - 91.415.5 %   Platelets 201 150 - 400 K/uL  Type and screen   Collection Time: 02/25/15 11:11 AM  Result Value Ref Range   ABO/RH(D) AB POS    Antibody Screen NEG    Sample Expiration  02/28/2015     Patient Active Problem List   Diagnosis Date Noted  . Active labor 02/25/2015  . [redacted] weeks gestation of pregnancy   . Decreased fetal movement   . Obesity in pregnancy, antepartum   . [redacted] weeks gestation of pregnancy   . Polyhydramnios   . Evaluate anatomy not seen on prior sonogram   . [redacted] weeks gestation of pregnancy   . Polyhydramnios in third trimester, antepartum   . Limited prenatal care in third trimester   . Encounter for fetal anatomic survey   . Late prenatal care affecting pregnancy in third trimester 12/24/2014    Assessment: Dorene Meredeth IdeFleming is a 29 y.o. G1P0000 at 4358w2d here for spontaneous onset of labor.  #Labor: expectant mgmt #Pain: epidural #FWB: Cat I #ID:  GBS + => ampicillin #MOF: bottle #MOC:mirena #BP: elevated BP, asymptomatic, normal exam => preE labs requested  Eleina Jergens ROCIO 02/25/2015, 1:14 PM

## 2015-02-26 ENCOUNTER — Telehealth: Payer: Self-pay | Admitting: *Deleted

## 2015-02-26 ENCOUNTER — Encounter: Payer: BLUE CROSS/BLUE SHIELD | Admitting: Advanced Practice Midwife

## 2015-02-26 LAB — RPR: RPR Ser Ql: NONREACTIVE

## 2015-02-26 LAB — HIV ANTIBODY (ROUTINE TESTING W REFLEX): HIV SCREEN 4TH GENERATION: NONREACTIVE

## 2015-02-26 MED ORDER — ZOLPIDEM TARTRATE 5 MG PO TABS: 5.0000 mg | ORAL_TABLET | Freq: Every evening | ORAL | Status: DC | PRN

## 2015-02-26 MED ORDER — LABETALOL HCL 5 MG/ML IV SOLN
20.0000 mg | Freq: Once | INTRAVENOUS | Status: DC
  Filled 2015-02-26: qty 4

## 2015-02-26 MED ORDER — BISACODYL 10 MG RE SUPP: 10.0000 mg | Freq: Every day | RECTAL | Status: DC | PRN

## 2015-02-26 MED ORDER — SODIUM CHLORIDE 0.9 % IJ SOLN: 3.0000 mL | Freq: Two times a day (BID) | INTRAMUSCULAR | Status: DC

## 2015-02-26 MED ORDER — BENZOCAINE-MENTHOL 20-0.5 % EX AERO: 1.0000 "application " | INHALATION_SPRAY | CUTANEOUS | Status: DC | PRN

## 2015-02-26 MED ORDER — SENNOSIDES-DOCUSATE SODIUM 8.6-50 MG PO TABS
2.0000 | ORAL_TABLET | ORAL | Status: DC
  Administered 2015-02-26 – 2015-02-27 (×2): 2 via ORAL
  Filled 2015-02-26 (×2): qty 2

## 2015-02-26 MED ORDER — IBUPROFEN 600 MG PO TABS
600.0000 mg | ORAL_TABLET | Freq: Four times a day (QID) | ORAL | Status: DC
  Administered 2015-02-26 – 2015-02-27 (×7): 600 mg via ORAL
  Filled 2015-02-26 (×7): qty 1

## 2015-02-26 MED ORDER — SODIUM CHLORIDE 0.9 % IJ SOLN
3.0000 mL | INTRAMUSCULAR | Status: DC | PRN
  Administered 2015-02-26: 3 mL via INTRAVENOUS
  Filled 2015-02-26: qty 3

## 2015-02-26 MED ORDER — ACETAMINOPHEN 325 MG PO TABS: 650.0000 mg | ORAL_TABLET | ORAL | Status: DC | PRN

## 2015-02-26 MED ORDER — SIMETHICONE 80 MG PO CHEW: 80.0000 mg | CHEWABLE_TABLET | ORAL | Status: DC | PRN

## 2015-02-26 MED ORDER — OXYTOCIN 40 UNITS IN LACTATED RINGERS INFUSION - SIMPLE MED: 62.5000 mL/h | INTRAVENOUS | Status: DC | PRN

## 2015-02-26 MED ORDER — ONDANSETRON HCL 4 MG PO TABS: 4.0000 mg | ORAL_TABLET | ORAL | Status: DC | PRN

## 2015-02-26 MED ORDER — PRENATAL MULTIVITAMIN CH
1.0000 | ORAL_TABLET | Freq: Every day | ORAL | Status: DC
  Administered 2015-02-26 – 2015-02-27 (×2): 1 via ORAL
  Filled 2015-02-26 (×2): qty 1

## 2015-02-26 MED ORDER — DIBUCAINE 1 % RE OINT: 1.0000 "application " | TOPICAL_OINTMENT | RECTAL | Status: DC | PRN

## 2015-02-26 MED ORDER — LANOLIN HYDROUS EX OINT: TOPICAL_OINTMENT | CUTANEOUS | Status: DC | PRN

## 2015-02-26 MED ORDER — ONDANSETRON HCL 4 MG/2ML IJ SOLN: 4.0000 mg | INTRAMUSCULAR | Status: DC | PRN

## 2015-02-26 MED ORDER — SODIUM CHLORIDE 0.9 % IV SOLN: 250.0000 mL | INTRAVENOUS | Status: DC | PRN

## 2015-02-26 MED ORDER — WITCH HAZEL-GLYCERIN EX PADS: 1.0000 "application " | MEDICATED_PAD | CUTANEOUS | Status: DC | PRN

## 2015-02-26 MED ORDER — DIPHENHYDRAMINE HCL 25 MG PO CAPS: 25.0000 mg | ORAL_CAPSULE | Freq: Four times a day (QID) | ORAL | Status: DC | PRN

## 2015-02-26 MED ORDER — FLEET ENEMA 7-19 GM/118ML RE ENEM: 1.0000 | ENEMA | Freq: Every day | RECTAL | Status: DC | PRN

## 2015-02-26 NOTE — Anesthesia Postprocedure Evaluation (Signed)
  Anesthesia Post-op Note  Patient: Mindy Davies  Procedure(s) Performed: * No procedures listed *  Patient Location: Mother/Baby  Anesthesia Type:Epidural  Level of Consciousness: awake  Airway and Oxygen Therapy: Patient Spontanous Breathing  Post-op Pain: mild  Post-op Assessment: Patient's Cardiovascular Status Stable and Respiratory Function Stable  Post-op Vital Signs: stable  Last Vitals:  Filed Vitals:   02/26/15 0438  BP: 130/84  Pulse: 60  Temp: 36.6 C  Resp: 16    Complications: No apparent anesthesia complications

## 2015-02-26 NOTE — Telephone Encounter (Signed)
Meredith ModySallie Martinez from [employer] left a message that they [employer] and need clarification on a patient that delivered. States they need to know if she got prenatal care and if first visit was prior to 07/30/14. Needs call back at [phone] ext [phone]. Called and left a message we need a signed release from patient before we can give that information and left fax number that they can fax release to us.

## 2015-02-26 NOTE — Progress Notes (Signed)
Post Partum Day 1 Subjective:  Mindy Davies is a 29 y.o. G1P1001 2925w2d s/p spontaneous vaginal delivery.  No acute events overnight.  Pt denies problems with ambulating, voiding or po intake.  She denies nausea or vomiting.  Pain is well controlled.  She has had flatus. She has had bowel movement.  Lochia Minimal.  Plan for birth control is IUD, Mirena out patient.  Method of Feeding: Bottle  Objective: Blood pressure 130/84, pulse 60, temperature 97.8 F (36.6 C), temperature source Oral, resp. rate 16, height 5\' 7"  (1.702 m), weight 133.414 kg (294 lb 2 oz), last menstrual period 03/11/2014, SpO2 98 %, not currently breastfeeding.  Physical Exam:  General: alert, cooperative and no distress Lochia:normal flow Chest: CTAB Heart: RRR no m/r/g Abdomen: +BS, soft, nontender,  Uterine Fundus: firm DVT Evaluation: No evidence of DVT seen on physical exam. Extremities: +1 bilateral peripheral edema   Recent Labs  02/25/15 1111  HGB 10.6*  HCT 31.5*    Assessment/Plan:  ASSESSMENT: Mindy Davies is a 29 y.o. G1P1001 7525w2d s/p SVD  Plan for discharge tomorrow   LOS: 1 day   Kandis FantasiaSenmiao  Jennise Both 02/26/2015, 7:12 AM

## 2015-02-26 NOTE — Progress Notes (Signed)
Clinical Social Work Department BRIEF PSYCHOSOCIAL ASSESSMENT 02/26/2015  Patient:  Mindy Davies,Mindy Davies     Account Number:  [id number]     Admit date:  02/25/2015  Clinical Social Worker:  Ridley Dileo, CLINICAL SOCIAL WORKER  Date/Time:  02/26/2015 10:00 AM  Referred by:  RN  Date Referred:  02/25/2015  Other Referral:   Late prenatal care-- MOB initiated care at 31 weeks.   Interview type:  Patient Other interview type:   MOB also provided consent for her mother to remain in the room.   PSYCHOSOCIAL DATA Living Status:  ALONE Primary support name:  Kenyatta Primary support relationship to patient:  SIBLING Degree of support available:   MOB reported that her mother is also actively involved. She stated that her mother and sister are her primary supports. MOB reported feeling satisfied with her level of support.    CURRENT CONCERNS Current Concerns    Other Concerns:   MOB arrived late to prenatal care.  She learned of the pregnancy on 12/19/14, and then established care at 31 weeks.     SOCIAL WORK ASSESSMENT / PLAN CSW received consult due to late prenatal care.  MOB presented as receptive and easily engaged. She displayed a full range in affect and was in a pleasant mood.  MOB smiled as she reflected upon her thoughts and feelings as she transitions to motherhood, and was holding/interacting with the infant during the visit.  She expressed feeling comfortable as she prepares to take the infant home since she endorsed having strong family support and previous experiences interacting with children.  MOB confirmed that the the home is well prepared.  MOB admitted that it has been a "fast" transition and preparation for the infant's arrival since she did not learn of the pregnancy until the end of January.  She stated that she was shocked and overwhelmed, but transitioned to becoming excited and looking forward to the transition. CSW provided support and discussed normative feelings  that may accompany this life transition.  CSW continued to discuss the baby blues and postpartum depression, and MOB agreed to contact her medical provider if she notes symptoms.   CSW provided education on hospital drug screen policy due to late prenatal care.  MOB verbalized understanding and denied substance use.  CSW discussed UDS and MDS and how information is monitored and used if there is a positive screen.  MOB denied additional questions or concerns regarding the policy.   Assessment/plan status:  No Further Intervention Required/No barriers to discharge Other assessment/ plan:   CSW provided education on postpartum depression.    CSW provided education on hospital drug screen policy. CSW to monitor UDS and MDS and will make a CPS report if positive for substances.   Information/referral to community resources:   No needs identified. No referrals at this time.   PATIENT'S/FAMILY'S RESPONSE TO PLAN OF CARE: MOB verbalized understanding of the hospital drug screen policy, and denied questions or concerns.  She denied substance use during the pregnancy and expressed confidence tha tthe infant UDS and MDS will be negative.        

## 2015-02-27 ENCOUNTER — Encounter: Payer: Self-pay | Admitting: Obstetrics & Gynecology

## 2015-02-27 MED ORDER — IBUPROFEN 600 MG PO TABS: 600.0000 mg | ORAL_TABLET | Freq: Four times a day (QID) | ORAL | Status: DC

## 2015-02-27 NOTE — Progress Notes (Signed)
This encounter was created in error - please disregard.

## 2015-02-27 NOTE — Discharge Instructions (Signed)

## 2015-02-27 NOTE — Discharge Summary (Signed)
Obstetric Discharge Summary Reason for Admission: onset of labor Prenatal Procedures: none Intrapartum Procedures: spontaneous vaginal delivery and GBS prophylaxis Postpartum Procedures: none Complications-Operative and Postpartum: none  Delivery Note At 6:28 PM a viable female was delivered via Vaginal, Spontaneous Delivery (Presentation: ; ). APGAR: 8, 9; weight 7 lb 1.9 oz (3229 g).  Placenta status: Intact, Spontaneous. Cord: with the following complications: None.   Anesthesia: Epidural  Episiotomy: None Lacerations: None Suture Repair: n/a Est. Blood Loss (mL): 250  Mom to postpartum. Baby to Couplet care / Skin to Skin.   Hospital Course:  Active Problems:   Active labor   Mindy Davies is a 29 y.o. G1P1001 s/p SVD.  Patient was admitted to L&D.  She has postpartum course that was uncomplicated including no problems with ambulating, PO intake, urination, pain, or bleeding. The pt feels ready to go home and  will be discharged with outpatient follow-up.   Today: No acute events overnight.  Pt denies problems with ambulating, voiding or po intake.  She denies nausea or vomiting.  Pain is well controlled.  Lochia Minimal.  Plan for birth control is  IUD.  Method of Feeding: Bottle  Physical Exam:  General: alert and no distress Lochia: appropriate Uterine Fundus: firm DVT Evaluation: No evidence of DVT seen on physical exam. Negative Homan's sign.  H/H: Lab Results  Component Value Date/Time   HGB 10.6* 02/25/2015 11:11 AM   HCT 31.5* 02/25/2015 11:11 AM    Discharge Diagnoses: Term Pregnancy-delivered  Discharge Information: Date: 02/27/2015 Activity: pelvic rest Diet: routine  Medications: Ibuprofen Breast feeding:  No Condition: stable Instructions: refer to handout Discharge to: home      Medication List    TAKE these medications        famotidine 20 MG tablet  Commonly known as:  PEPCID  Take 1 tablet (20 mg total) by mouth 2 (two)  times daily.     ibuprofen 600 MG tablet  Commonly known as:  ADVIL,MOTRIN  Take 1 tablet (600 mg total) by mouth every 6 (six) hours.     prenatal vitamin w/FE, FA 27-1 MG Tabs tablet  Take 1 tablet by mouth daily.           Follow-up Information    [facility]   Specialty:  Obstetrics and Gynecology   Contact information:   [address] [place] [id number] [phone]     Johny SaxChantel Davies is a 29 y.o. G1P1001 s/p SVD.  Pt is well and ready to go home today.  Senmiao  Zhan  02/27/2015,7:04 AM  OB CNM attestation I have seen and examined this patient and agree with above documentation in the medical student's note.   Mindy Davies is a 29 y.o. G1P1001 s/p NSVD.   Pain is well controlled.  Plan for birth control is IUD.  Method of Feeding: bottle.  PE:  BP 131/73 mmHg  Pulse 59  Temp(Src) 97.5 F (36.4 C) (Oral)  Resp 18  Ht 5\' 7"  (1.702 m)  Wt 133.414 kg (294 lb 2 oz)  BMI 46.06 kg/m2  SpO2 98%  LMP 03/11/2014  Breastfeeding? Unknown  General: alert, cooperative and appears stated age Lochia: appropriate Uterine Fundus: firm Incision: n/a DVT Evaluation: No evidence of DVT seen on physical exam. Negative Homan's sign.  Recent Labs  02/25/15 1111  HGB 10.6*  HCT 31.5*     Plan: discharge today - postpartum care discussed - f/u clinic in  6 weeks for postpartum visit   Marlis Edelson, CNM 8:31 AM

## 2015-02-28 ENCOUNTER — Telehealth: Payer: Self-pay | Admitting: *Deleted

## 2015-02-28 NOTE — Telephone Encounter (Signed)
Kennon RoundsSally from [employer] called the clinic to inquire about the [facility].  Phone number [phone].   Attempted to contact Kennon RoundsSally, no answer, left message with [facility] clinic 12/24/14.

## 2015-03-03 ENCOUNTER — Encounter: Payer: Self-pay | Admitting: General Practice

## 2015-03-03 NOTE — Progress Notes (Signed)
Patient came by office needed note to be work for work following the delivery of her baby on march 29th. Patient states she was supposed to be induced on March 20 but that was cancelled but will need that date to be the start in her note. Patient does not qualify for FMLA, so only need note for work to secure job. Note provided to patient per Sharen CounterLisa Leftwich Kirby CNM

## 2015-03-21 ENCOUNTER — Ambulatory Visit: Payer: BLUE CROSS/BLUE SHIELD | Admitting: Physician Assistant

## 2015-04-04 ENCOUNTER — Encounter: Payer: Self-pay | Admitting: *Deleted

## 2015-04-04 ENCOUNTER — Telehealth: Payer: Self-pay | Admitting: *Deleted

## 2015-04-04 NOTE — Telephone Encounter (Addendum)
Mindy Davies left a voice message she has a postpartum visit on 16th, but is scheduled to return to work on 11th.Wants to know if she can get a return to work note before the 11th.   5/6 1150  I called pt after consult w/Deirdre Poe CNM. I confirmed with pt that she is not having any problems since her baby's birth.  I informed her that a letter can be provided stating that she may return to work on 04/09/15. She may pick up the letter on Monday 5/9 from 8-12 or 1-5.  Pt voiced understanding.   Diane Day RNC

## 2015-04-14 ENCOUNTER — Ambulatory Visit (INDEPENDENT_AMBULATORY_CARE_PROVIDER_SITE_OTHER): Payer: BLUE CROSS/BLUE SHIELD | Admitting: Obstetrics & Gynecology

## 2015-04-14 ENCOUNTER — Encounter: Payer: Self-pay | Admitting: Obstetrics & Gynecology

## 2015-04-14 NOTE — Progress Notes (Signed)
     Subjective:     Mindy Davies is a 29 y.o. 271P1001 female who presents for a postpartum visit. She is 6 weeks postpartum following a spontaneous vaginal delivery. I have fully reviewed the prenatal and intrapartum course. The delivery was at 40 2/7 gestational weeks.Anesthesia: none. Postpartum course has been uncomplicated Baby's course has been uncomplicated. Baby is feeding by formula. Bleeding no bleeding. Bowel function is normal. Bladder function is normal. Patient is sexually active. Contraception method is none. Postpartum depression screening: negative.  The following portions of the patient's history were reviewed and updated as appropriate: allergies, current medications, past family history, past medical history, past social history, past surgical history and problem list.  Normal pap on 12/24/2014.  Review of Systems Pertinent items are noted in HPI.   Objective:    BP 121/81 mmHg  Pulse 66  Temp(Src) 98.2 F (36.8 C) (Oral)  Ht 5\' 7"  (1.702 m)  Wt 270 lb 6.4 oz (122.653 kg)  BMI 42.34 kg/m2  Breastfeeding? No  General:  alert and no distress   Breasts:  deferred  Lungs: clear to auscultation bilaterally  Heart:  regular rate and rhythm  Abdomen: soft, non-tender; bowel sounds normal; no masses,  no organomegaly  Pelvic:  not evaluated        Assessment:   Normal postpartum exam. Pap smear not done at today's visit.   Plan:   1. Contraception: none and encouraged to continue PNV in case she conceives soon. 2. Follow up as needed.   Jaynie CollinsUGONNA  ANYANWU, MD, FACOG Attending Obstetrician & Gynecologist Center for [facility], [facility]

## 2015-12-29 ENCOUNTER — Encounter (HOSPITAL_COMMUNITY): Payer: Self-pay | Admitting: Emergency Medicine

## 2015-12-29 ENCOUNTER — Emergency Department (HOSPITAL_COMMUNITY): Payer: BLUE CROSS/BLUE SHIELD

## 2015-12-29 ENCOUNTER — Emergency Department (HOSPITAL_COMMUNITY)
Admission: EM | Admit: 2015-12-29 | Discharge: 2015-12-29 | Disposition: A | Discharge status: Home / Self Care | Payer: BLUE CROSS/BLUE SHIELD | Attending: Emergency Medicine | Admitting: Emergency Medicine

## 2015-12-29 DIAGNOSIS — Z3202 Encounter for pregnancy test, result negative: Secondary | ICD-10-CM | POA: Insufficient documentation

## 2015-12-29 DIAGNOSIS — N201 Calculus of ureter: Secondary | ICD-10-CM

## 2015-12-29 DIAGNOSIS — R109 Unspecified abdominal pain: Secondary | ICD-10-CM | POA: Diagnosis present

## 2015-12-29 LAB — CBC WITH DIFFERENTIAL/PLATELET
BASOS ABS: 0 10*3/uL (ref 0.0–0.1)
Basophils Relative: 0 %
EOS PCT: 0 %
Eosinophils Absolute: 0 10*3/uL (ref 0.0–0.7)
HCT: 39.5 % (ref 36.0–46.0)
HEMOGLOBIN: 12.6 g/dL (ref 12.0–15.0)
LYMPHS PCT: 11 %
Lymphs Abs: 1.3 10*3/uL (ref 0.7–4.0)
MCH: 30 pg (ref 26.0–34.0)
MCHC: 31.9 g/dL (ref 30.0–36.0)
MCV: 94 fL (ref 78.0–100.0)
Monocytes Absolute: 0.3 10*3/uL (ref 0.1–1.0)
Monocytes Relative: 3 %
NEUTROS ABS: 10 10*3/uL — AB (ref 1.7–7.7)
NEUTROS PCT: 86 %
PLATELETS: 313 10*3/uL (ref 150–400)
RBC: 4.2 MIL/uL (ref 3.87–5.11)
RDW: 12.6 % (ref 11.5–15.5)
WBC: 11.6 10*3/uL — AB (ref 4.0–10.5)

## 2015-12-29 LAB — I-STAT BETA HCG BLOOD, ED (MC, WL, AP ONLY): I-stat hCG, quantitative: <5 m[IU]/mL (ref <5)

## 2015-12-29 LAB — URINALYSIS, ROUTINE W REFLEX MICROSCOPIC
Bilirubin Urine: NEGATIVE
Glucose, UA: NEGATIVE mg/dL
Ketones, ur: NEGATIVE mg/dL
Nitrite: NEGATIVE
Protein, ur: NEGATIVE mg/dL
Specific Gravity, Urine: 1.014 (ref 1.005–1.030)
pH: 6.5 (ref 5.0–8.0)

## 2015-12-29 LAB — URINE MICROSCOPIC-ADD ON

## 2015-12-29 LAB — BASIC METABOLIC PANEL
ANION GAP: 7 (ref 5–15)
BUN: 10 mg/dL (ref 6–20)
CO2: 28 mmol/L (ref 22–32)
Calcium: 8.9 mg/dL (ref 8.9–10.3)
Chloride: 105 mmol/L (ref 101–111)
Creatinine, Ser: 0.96 mg/dL (ref 0.44–1.00)
GFR calc Af Amer: >60 mL/min (ref >60)
GFR calc non Af Amer: >60 mL/min (ref >60)
Glucose, Bld: 101 mg/dL — ABNORMAL HIGH (ref 65–99)
POTASSIUM: 4.1 mmol/L (ref 3.5–5.1)
SODIUM: 140 mmol/L (ref 135–145)

## 2015-12-29 MED ORDER — ONDANSETRON HCL 4 MG PO TABS: 4.0000 mg | ORAL_TABLET | Freq: Three times a day (TID) | ORAL | Status: DC | PRN

## 2015-12-29 MED ORDER — HYDROMORPHONE HCL 1 MG/ML IJ SOLN
1.0000 mg | Freq: Once | INTRAMUSCULAR | Status: AC
  Administered 2015-12-29: 1 mg via INTRAVENOUS
  Filled 2015-12-29: qty 1

## 2015-12-29 MED ORDER — KETOROLAC TROMETHAMINE 30 MG/ML IJ SOLN
30.0000 mg | Freq: Once | INTRAMUSCULAR | Status: AC
  Administered 2015-12-29: 30 mg via INTRAVENOUS
  Filled 2015-12-29: qty 1

## 2015-12-29 MED ORDER — OXYCODONE-ACETAMINOPHEN 5-325 MG PO TABS: 1.0000 | ORAL_TABLET | ORAL | Status: DC | PRN

## 2015-12-29 MED ORDER — TAMSULOSIN HCL 0.4 MG PO CAPS: 0.4000 mg | ORAL_CAPSULE | Freq: Every day | ORAL | Status: DC

## 2015-12-29 NOTE — ED Notes (Signed)
US at bedside

## 2015-12-29 NOTE — Discharge Instructions (Signed)
Read the information below.  Use the prescribed medication as directed.  Please discuss all new medications with your pharmacist.  Do not take additional tylenol while taking the prescribed pain medication to avoid overdose.  You may return to the Emergency Department at any time for worsening condition or any new symptoms that concern you.    If you develop high fevers, worsening abdominal pain, uncontrolled vomiting, are unable to urinate, or are unable to tolerate fluids by mouth, return to the ER for a recheck.     Kidney Stones Kidney stones (urolithiasis) are deposits that form inside your kidneys. The intense pain is caused by the stone moving through the urinary tract. When the stone moves, the ureter goes into spasm around the stone. The stone is usually passed in the urine.  CAUSES   A disorder that makes certain neck glands produce too much parathyroid hormone (primary hyperparathyroidism).  A buildup of uric acid crystals, similar to gout in your joints.  Narrowing (stricture) of the ureter.  A kidney obstruction present at birth (congenital obstruction).  Previous surgery on the kidney or ureters.  Numerous kidney infections. SYMPTOMS   Feeling sick to your stomach (nauseous).  Throwing up (vomiting).  Blood in the urine (hematuria).  Pain that usually spreads (radiates) to the groin.  Frequency or urgency of urination. DIAGNOSIS   Taking a history and physical exam.  Blood or urine tests.  CT scan.  Occasionally, an examination of the inside of the urinary bladder (cystoscopy) is performed. TREATMENT   Observation.  Increasing your fluid intake.  Extracorporeal shock wave lithotripsy--This is a noninvasive procedure that uses shock waves to break up kidney stones.  Surgery may be needed if you have severe pain or persistent obstruction. There are various surgical procedures. Most of the procedures are performed with the use of small instruments. Only small  incisions are needed to accommodate these instruments, so recovery time is minimized. The size, location, and chemical composition are all important variables that will determine the proper choice of action for you. Talk to your health care provider to better understand your situation so that you will minimize the risk of injury to yourself and your kidney.  HOME CARE INSTRUCTIONS   Drink enough water and fluids to keep your urine clear or pale yellow. This will help you to pass the stone or stone fragments.  Strain all urine through the provided strainer. Keep all particulate matter and stones for your health care provider to see. The stone causing the pain may be as small as a grain of salt. It is very important to use the strainer each and every time you pass your urine. The collection of your stone will allow your health care provider to analyze it and verify that a stone has actually passed. The stone analysis will often identify what you can do to reduce the incidence of recurrences.  Only take over-the-counter or prescription medicines for pain, discomfort, or fever as directed by your health care provider.  Keep all follow-up visits as told by your health care provider. This is important.  Get follow-up X-rays if required. The absence of pain does not always mean that the stone has passed. It may have only stopped moving. If the urine remains completely obstructed, it can cause loss of kidney function or even complete destruction of the kidney. It is your responsibility to make sure X-rays and follow-ups are completed. Ultrasounds of the kidney can show blockages and the status of the kidney.  Ultrasounds are not associated with any radiation and can be performed easily in a matter of minutes.  Make changes to your daily diet as told by your health care provider. You may be told to:  Limit the amount of salt that you eat.  Eat 5 or more servings of fruits and vegetables each day.  Limit  the amount of meat, poultry, fish, and eggs that you eat.  Collect a 24-hour urine sample as told by your health care provider.You may need to collect another urine sample every 6-12 months. SEEK MEDICAL CARE IF:  You experience pain that is progressive and unresponsive to any pain medicine you have been prescribed. SEEK IMMEDIATE MEDICAL CARE IF:   Pain cannot be controlled with the prescribed medicine.  You have a fever or shaking chills.  The severity or intensity of pain increases over 18 hours and is not relieved by pain medicine.  You develop a new onset of abdominal pain.  You feel faint or pass out.  You are unable to urinate.   This information is not intended to replace advice given to you by your health care provider. Make sure you discuss any questions you have with your health care provider.   Document Released: 11/15/2005 Document Revised: 08/06/2015 Document Reviewed: 04/18/2013 Elsevier Interactive Patient Education 2016 Elsevier Inc.  Dietary Guidelines to Help Prevent Kidney Stones Your risk of kidney stones can be decreased by adjusting the foods you eat. The most important thing you can do is drink enough fluid. You should drink enough fluid to keep your urine clear or pale yellow. The following guidelines provide specific information for the type of kidney stone you have had. GUIDELINES ACCORDING TO TYPE OF KIDNEY STONE Calcium Oxalate Kidney Stones  Reduce the amount of salt you eat. Foods that have a lot of salt cause your body to release excess calcium into your urine. The excess calcium can combine with a substance called oxalate to form kidney stones.  Reduce the amount of animal protein you eat if the amount you eat is excessive. Animal protein causes your body to release excess calcium into your urine. Ask your dietitian how much protein from animal sources you should be eating.  Avoid foods that are high in oxalates. If you take vitamins, they should  have less than 500 mg of vitamin C. Your body turns vitamin C into oxalates. You do not need to avoid fruits and vegetables high in vitamin C. Calcium Phosphate Kidney Stones  Reduce the amount of salt you eat to help prevent the release of excess calcium into your urine.  Reduce the amount of animal protein you eat if the amount you eat is excessive. Animal protein causes your body to release excess calcium into your urine. Ask your dietitian how much protein from animal sources you should be eating.  Get enough calcium from food or take a calcium supplement (ask your dietitian for recommendations). Food sources of calcium that do not increase your risk of kidney stones include:  Broccoli.  Dairy products, such as cheese and yogurt.  Pudding. Uric Acid Kidney Stones  Do not have more than 6 oz of animal protein per day. FOOD SOURCES Animal Protein Sources  Meat (all types).  Poultry.  Eggs.  Fish, seafood. Foods High in [employer] seasonings.  Soy sauce.  Teriyaki sauce.  Cured and processed meats.  Salted crackers and snack foods.  Fast food.  Canned soups and most canned foods. Foods High in Oxalates  Grains:  Amaranth.  Barley.  Grits.  Wheat germ.  Bran.  Buckwheat flour.  All bran cereals.  Pretzels.  Whole wheat bread.  Vegetables:  Beans (wax).  Beets and beet greens.  Collard greens.  Eggplant.  Escarole.  Leeks.  Okra.  Parsley.  Rutabagas.  Spinach.  Swiss chard.  Tomato paste.  Fried potatoes.  Sweet potatoes.  Fruits:  Red currants.  Figs.  Kiwi.  Rhubarb.  Meat and Other Protein Sources:  Beans (dried).  Soy burgers and other soybean products.  Miso.  Nuts (peanuts, almonds, pecans, cashews, hazelnuts).  Nut butters.  Sesame seeds and tahini (paste made of sesame seeds).  Poppy seeds.  Beverages:  Chocolate drink mixes.  Soy milk.  Instant iced tea.  Juices made from  high-oxalate fruits or vegetables.  Other:  Carob.  Chocolate.  Fruitcake.  Marmalades.   This information is not intended to replace advice given to you by your health care provider. Make sure you discuss any questions you have with your health care provider.   Document Released: 03/12/2011 Document Revised: 11/20/2013 Document Reviewed: 10/12/2013 Elsevier Interactive Patient Education [employer]

## 2015-12-29 NOTE — ED Notes (Signed)
Pt states she was at work and started having L sided flank pain. Hx of kidney stones in the past. 20g L hand IV. of fentanyl given en route along with  zofran. Alert and oriented.

## 2015-12-29 NOTE — Progress Notes (Signed)
EDCM spoke to patient at bedside.  Patient listed as not having a pcp, has [facility].  EDCM provided patient with list of pcps who accept [employer] insurance within a 5 mile radius of patient's zip code.  Patient thankful for resources.  No further EDCM needs at this time.

## 2015-12-29 NOTE — ED Notes (Signed)
MD at bedside. 

## 2015-12-29 NOTE — ED Provider Notes (Signed)
CSN: [id number]     Arrival date & time 12/29/15  1633 History   First MD Initiated Contact with Patient 12/29/15 1940     Chief Complaint  Patient presents with  . Flank Pain     (Consider location/radiation/quality/duration/timing/severity/associated sxs/prior Treatment) The history is provided by the patient.     Pt with hx kidney stone presents with left flank pain that began abuptly after having a normal BM this afternoon.  Has since felt like she needs to have another BM but has not.  Pain is constant with occasional sharp pains.  Denies urinary, vaginal symptoms, fevers.  LMP December, current menstrual period is late.  Had a baby in March 2016.  Denies breastfeeding.    Past Medical History  Diagnosis Date  . Medical history non-contributory    Past Surgical History  Procedure Laterality Date  . Wisdom tooth extraction    . Bunionectomy     Family History  Problem Relation Age of Onset  . Cancer Paternal Grandmother    Social History  Substance Use Topics  . Smoking status: Never Smoker   . Smokeless tobacco: Never Used  . Alcohol Use: No   OB History    Gravida Para Term Preterm AB TAB SAB Ectopic Multiple Living   0 0 0 0 0 0 1     Review of Systems  All other systems reviewed and are negative.     Allergies  Review of patient's allergies indicates no known allergies.  Home Medications   Prior to Admission medications   Medication Sig Start Date End Date Taking? Authorizing Provider  famotidine (PEPCID) 20 MG tablet Take 1 tablet (20 mg total) by mouth 2 (two) times daily. Patient not taking: Reported on 04/14/2015 01/10/15 01/10/16  Dorathy Kinsman, CNM  ibuprofen (ADVIL,MOTRIN) 600 MG tablet Take 1 tablet (600 mg total) by mouth every 6 (six) hours. Patient not taking: Reported on 04/14/2015 02/27/15   [place] N Rum[place], DO   BP 143/90 mmHg  Pulse 78  Temp(Src) 98.2 F (36.8 C) (Oral)  SpO2 100%  LMP 10/30/2015 Physical Exam   Constitutional: She appears well-developed and well-nourished. No distress.  HENT:  Head: Normocephalic and atraumatic.  Neck: Neck supple.  Cardiovascular: Normal rate and regular rhythm.   Pulmonary/Chest: Effort normal and breath sounds normal. No respiratory distress. She has no wheezes. She has no rales.  Abdominal: Soft. She exhibits no distension. There is tenderness. There is CVA tenderness. There is no rebound and no guarding.  Left flank diffusely tender.  +left CVA tenderness  Neurological: She is alert.  Skin: She is not diaphoretic.  Nursing note and vitals reviewed.   ED Course  Procedures (including critical care time) Labs Review Labs Reviewed  URINALYSIS, ROUTINE W REFLEX MICROSCOPIC (NOT AT [facility]) - Abnormal; Notable for the following:    APPearance CLOUDY (*)    Hgb urine dipstick MODERATE (*)    Leukocytes, UA SMALL (*)    All other components within normal limits  BASIC METABOLIC PANEL - Abnormal; Notable for the following:    Glucose, Bld 101 (*)    All other components within normal limits  CBC WITH DIFFERENTIAL/PLATELET - Abnormal; Notable for the following:    WBC 11.6 (*)    Neutro Abs 10.0 (*)    All other components within normal limits  URINE MICROSCOPIC-ADD ON - Abnormal; Notable for the following:    Squamous Epithelial / LPF 0-5 (*)    Bacteria, UA RARE (*)  All other components within normal limits  URINE CULTURE  I-STAT BETA HCG BLOOD, ED ([facility], [facility], [facility] ONLY)    Imaging Review Dg Abd 1 View  12/29/2015  CLINICAL DATA:  Left flank and left-sided abdominal pain. EXAM: ABDOMEN - 1 VIEW COMPARISON:  CT scan dated 04/11/2014 FINDINGS: There is an 8 mm oblong calcification in the left side of the pelvis which is new since the prior CT scan consistent with a distal left ureteral stone. Multiple bilateral renal calculi are present. The bowel gas pattern is normal.  Osseous structures are normal. IMPRESSION: 8 mm stone in the distal left ureter.  Multiple bilateral renal calculi. Electronically Signed   By: Francene Boyers M.D.   On: 12/29/2015 21:03   US Renal  12/29/2015  CLINICAL DATA:  Left flank pain EXAM: RENAL / URINARY TRACT ULTRASOUND COMPLETE COMPARISON:  12/29/2015 and 02/22/2015 FINDINGS: Right Kidney: Length: 14.4 cm. Several stones are identified measuring up to 5 mm. No hydronephrosis. Echogenicity within normal limits. No mass or hydronephrosis visualized. Left Kidney: Length: 13.2 cm. Mild hydronephrosis. Several stones are identified. The largest is in the inferior pole measuring 4 mm. Echogenicity within normal limits. Bladder: Appears normal for degree of bladder distention. IMPRESSION: 1. Mild left hydronephrosis. 2. Bilateral nephrolithiasis. Electronically Signed   By: Signa Kell M.D.   On: 12/29/2015 21:47   I have personally reviewed and evaluated these images and lab results as part of my medical decision-making.   EKG Interpretation None      MDM   Final diagnoses:  Left ureteral stone   Afebrile nontoxic patient with acute onset left flank pain after normal BM.  Labs remarkable for mild leukocytosis.  UAwith hematuria, no e/o infection.  Culture pending. Pregnancy test negative.  Renal US mild hydronephrosis and multiple stones.  KUB distal left ureter with 8mm stone.  Pain controlled in ED.  D/C home with percocet, zofran, urology follow up, strainer.  Discussed result, findings, treatment, and follow up  with patient.  Pt given return precautions.  Pt verbalizes understanding and agrees with plan.         Trixie Dredge, PA-C 12/30/15 0014  Raeford Razor, MD 12/31/15 [phone]

## 2015-12-31 LAB — URINE CULTURE

## 2016-08-08 ENCOUNTER — Encounter (HOSPITAL_BASED_OUTPATIENT_CLINIC_OR_DEPARTMENT_OTHER): Payer: Self-pay | Admitting: *Deleted

## 2016-08-08 ENCOUNTER — Emergency Department (HOSPITAL_BASED_OUTPATIENT_CLINIC_OR_DEPARTMENT_OTHER)
Admission: EM | Admit: 2016-08-08 | Discharge: 2016-08-08 | Disposition: A | Discharge status: Home / Self Care | Payer: BLUE CROSS/BLUE SHIELD | Attending: Emergency Medicine | Admitting: Emergency Medicine

## 2016-08-08 ENCOUNTER — Emergency Department (HOSPITAL_BASED_OUTPATIENT_CLINIC_OR_DEPARTMENT_OTHER): Payer: BLUE CROSS/BLUE SHIELD

## 2016-08-08 DIAGNOSIS — N200 Calculus of kidney: Secondary | ICD-10-CM | POA: Insufficient documentation

## 2016-08-08 DIAGNOSIS — Z79899 Other long term (current) drug therapy: Secondary | ICD-10-CM | POA: Insufficient documentation

## 2016-08-08 DIAGNOSIS — Z791 Long term (current) use of non-steroidal anti-inflammatories (NSAID): Secondary | ICD-10-CM | POA: Insufficient documentation

## 2016-08-08 DIAGNOSIS — N390 Urinary tract infection, site not specified: Secondary | ICD-10-CM | POA: Diagnosis not present

## 2016-08-08 DIAGNOSIS — R109 Unspecified abdominal pain: Secondary | ICD-10-CM | POA: Diagnosis present

## 2016-08-08 LAB — CBC WITH DIFFERENTIAL/PLATELET
BASOS ABS: 0 10*3/uL (ref 0.0–0.1)
Basophils Relative: 0 %
EOS ABS: 0 10*3/uL (ref 0.0–0.7)
EOS PCT: 0 %
HCT: 38.4 % (ref 36.0–46.0)
HEMOGLOBIN: 12.4 g/dL (ref 12.0–15.0)
LYMPHS ABS: 1.4 10*3/uL (ref 0.7–4.0)
Lymphocytes Relative: 11 %
MCH: 29.5 pg (ref 26.0–34.0)
MCHC: 32.3 g/dL (ref 30.0–36.0)
MCV: 91.4 fL (ref 78.0–100.0)
Monocytes Absolute: 0.5 10*3/uL (ref 0.1–1.0)
Monocytes Relative: 4 %
Neutro Abs: 10.7 10*3/uL — ABNORMAL HIGH (ref 1.7–7.7)
Neutrophils Relative %: 85 %
PLATELETS: 307 10*3/uL (ref 150–400)
RBC: 4.2 MIL/uL (ref 3.87–5.11)
RDW: 12.4 % (ref 11.5–15.5)
WBC: 12.6 10*3/uL — AB (ref 4.0–10.5)

## 2016-08-08 LAB — BASIC METABOLIC PANEL
ANION GAP: 9 (ref 5–15)
BUN: 10 mg/dL (ref 6–20)
CALCIUM: 9 mg/dL (ref 8.9–10.3)
CHLORIDE: 104 mmol/L (ref 101–111)
CO2: 24 mmol/L (ref 22–32)
Creatinine, Ser: 0.96 mg/dL (ref 0.44–1.00)
GFR calc Af Amer: >60 mL/min (ref >60)
GFR calc non Af Amer: >60 mL/min (ref >60)
Glucose, Bld: 120 mg/dL — ABNORMAL HIGH (ref 65–99)
Potassium: 4 mmol/L (ref 3.5–5.1)
SODIUM: 137 mmol/L (ref 135–145)

## 2016-08-08 LAB — URINALYSIS, ROUTINE W REFLEX MICROSCOPIC
BILIRUBIN URINE: NEGATIVE
Glucose, UA: NEGATIVE mg/dL
KETONES UR: NEGATIVE mg/dL
Nitrite: POSITIVE — AB
PH: 6 (ref 5.0–8.0)
Protein, ur: NEGATIVE mg/dL
SPECIFIC GRAVITY, URINE: 1.02 (ref 1.005–1.030)

## 2016-08-08 LAB — URINE MICROSCOPIC-ADD ON

## 2016-08-08 LAB — PREGNANCY, URINE: Preg Test, Ur: NEGATIVE

## 2016-08-08 MED ORDER — HYDROMORPHONE HCL 1 MG/ML IJ SOLN
0.5000 mg | Freq: Once | INTRAMUSCULAR | Status: AC
  Administered 2016-08-08: 0.5 mg via INTRAVENOUS
  Filled 2016-08-08: qty 1

## 2016-08-08 MED ORDER — HYDROMORPHONE HCL 1 MG/ML IJ SOLN
1.0000 mg | Freq: Once | INTRAMUSCULAR | Status: AC
  Administered 2016-08-08: 1 mg via INTRAVENOUS
  Filled 2016-08-08: qty 1

## 2016-08-08 MED ORDER — ONDANSETRON HCL 4 MG/2ML IJ SOLN
4.0000 mg | Freq: Once | INTRAMUSCULAR | Status: AC
  Administered 2016-08-08: 4 mg via INTRAVENOUS
  Filled 2016-08-08: qty 2

## 2016-08-08 MED ORDER — CIPROFLOXACIN HCL 500 MG PO TABS: 500.0000 mg | ORAL_TABLET | Freq: Two times a day (BID) | ORAL | 0 refills | Status: DC

## 2016-08-08 MED ORDER — OXYCODONE-ACETAMINOPHEN 5-325 MG PO TABS: 1.0000 | ORAL_TABLET | Freq: Four times a day (QID) | ORAL | 0 refills | Status: DC | PRN

## 2016-08-08 NOTE — ED Provider Notes (Signed)
[facility] Provider Note   CSN: [id number] Arrival date & time: 08/08/16  [id number]     History   Chief Complaint Chief Complaint  Patient presents with  . Flank Pain    HPI Mindy Davies is a 30 y.o. female.  The history is provided by the patient.  Flank Pain  Associated symptoms include abdominal pain. Pertinent negatives include no shortness of breath.  Patient presents acute onset of right flank and right abdominal pain. Began around 3:30 in the morning today. It is severe. No dysuria. Some nausea without vomiting. No diarrhea. Previous kidney stones but states this is more severe and feels somewhat different. Patient is crying and is difficult to get history from.  Past Medical History:  Diagnosis Date  . Kidney stones   . Medical history non-contributory     There are no active problems to display for this patient.   Past Surgical History:  Procedure Laterality Date  . BUNIONECTOMY    . WISDOM TOOTH EXTRACTION      OB History    Gravida Para Term Preterm AB Living   1 1 1  0 0 1   SAB TAB Ectopic Multiple Live Births   0 0 0 0 1       Home Medications    Prior to Admission medications   Medication Sig Start Date End Date Taking? Authorizing Provider  tamsulosin (FLOMAX) 0.4 MG CAPS capsule Take 1 capsule (0.4 mg total) by mouth daily. 12/29/15  Yes Trixie DredgeEmily West, PA-C  famotidine (PEPCID) 20 MG tablet Take 1 tablet (20 mg total) by mouth 2 (two) times daily. Patient not taking: Reported on 04/14/2015 01/10/15 01/10/16  Dorathy KinsmanVirginia Smith, CNM  ibuprofen (ADVIL,MOTRIN) 600 MG tablet Take 1 tablet (600 mg total) by mouth every 6 (six) hours. Patient not taking: Reported on 04/14/2015 02/27/15   [place] N Rumley, DO  ondansetron (ZOFRAN) 4 MG tablet Take 1 tablet (4 mg total) by mouth every 8 (eight) hours as needed for nausea or vomiting. 12/29/15   Trixie DredgeEmily West, PA-C  oxyCODONE-acetaminophen (PERCOCET/ROXICET) 5-325 MG tablet Take 1-2 tablets by mouth every 4  (four) hours as needed for moderate pain or severe pain. 12/29/15   Trixie DredgeEmily West, PA-C    Family History Family History  Problem Relation Age of Onset  . Cancer Paternal Grandmother     Social History Social History  Substance Use Topics  . Smoking status: Never Smoker  . Smokeless tobacco: Never Used  . Alcohol use No     Allergies   Review of patient's allergies indicates no known allergies.   Review of Systems Review of Systems  Constitutional: Negative for fever.  Respiratory: Negative for shortness of breath.   Gastrointestinal: Positive for abdominal pain and nausea.  Genitourinary: Positive for flank pain. Negative for dysuria.     Physical Exam Updated Vital Signs BP 131/88 (BP Location: Right Arm)   Pulse 66   Temp 98.2 F (36.8 C) (Oral)   Resp 20   Ht 5\' 7"  (1.702 m)   Wt 295 lb (133.8 kg)   SpO2 100%   BMI 46.20 kg/m   Physical Exam  Constitutional: She appears well-developed.  Patient is obese. Patient is crying.  HENT:  Head: Atraumatic.  Neck: Neck supple.  Cardiovascular: Normal rate.   Pulmonary/Chest: Effort normal.  Abdominal: There is tenderness.  Tenderness in right lower quadrant. No hernias palpated.  Genitourinary:  Genitourinary Comments: Right-sided CVA tenderness.  Musculoskeletal: Normal range of motion.  Neurological: She  is alert.  Skin: Skin is warm. Capillary refill takes less than 2 seconds.     ED Treatments / Results  Labs (all labs ordered are listed, but only abnormal results are displayed) Labs Reviewed  BASIC METABOLIC PANEL  URINALYSIS, ROUTINE W REFLEX MICROSCOPIC (NOT AT [facility])  PREGNANCY, URINE  CBC WITH DIFFERENTIAL/PLATELET    EKG  EKG Interpretation None       Radiology No results found.  Procedures Procedures (including critical care time)  Medications Ordered in ED Medications  HYDROmorphone (DILAUDID) injection 1 mg (not administered)  ondansetron (ZOFRAN) injection 4 mg (not  administered)     Initial Impression / Assessment and Plan / ED Course  I have reviewed the triage vital signs and the nursing notes.  Pertinent labs & imaging results that were available during my care of the patient were reviewed by me and considered in my medical decision making (see chart for details).  Clinical Course  Patient with right-sided flank/abdominal pain. Feels somewhat better after treatment. Urine shows likely infection. CT scan done due to previous kidney stones and worrisome story. Hydronephrosis on the right side without clear ureteral stone. However I think the stone that was in the left UVJ area was likely a passed stone from the right side. Good kidney function and white count minimally elevated. Afebrile here. Will treat with antibiotics for the infection but I do not think this is an infected obstruction. Will have follow-up with urology since patient has numerous other stones also.  Final Clinical Impressions(s) / ED Diagnoses   Final diagnoses:  None    New Prescriptions New Prescriptions   No medications on file     Benjiman Core, MD 08/08/16 1053

## 2016-08-10 LAB — URINE CULTURE: Culture: >=100000 — AB

## 2016-08-11 ENCOUNTER — Telehealth (HOSPITAL_BASED_OUTPATIENT_CLINIC_OR_DEPARTMENT_OTHER): Payer: Self-pay

## 2016-08-11 NOTE — Telephone Encounter (Signed)
Post ED Visit - Positive Culture Follow-up  Culture report reviewed by antimicrobial stewardship pharmacist:  []  Enzo BiNathan Batchelder, Pharm.D. []  Celedonio MiyamotoJeremy Frens, Pharm.D., BCPS []  Garvin FilaMike Maccia, Pharm.D. []  Georgina PillionElizabeth Martin, Pharm.D., BCPS []  [place]Minh Pham, [place]Pharm.D., BCPS, AAHIVP []  Estella HuskMichelle Turner, Pharm.D., BCPS, AAHIVP []  Tennis Mustassie Stewart, Pharm.D. []  Sherle Poeob Vincent, [address]Pharm[address]Bailey Mech. Emily Stewart Pharm D Positive Urine culture Treated with Ciprofloxacin, organism sensitive to the same and no further patient follow-up is required at this time.  Jerry CarasCullom, Daved Mcfann Burnett 08/11/2016, 8:43 AM

## 2018-03-02 ENCOUNTER — Other Ambulatory Visit: Payer: Self-pay

## 2018-03-02 ENCOUNTER — Emergency Department (HOSPITAL_BASED_OUTPATIENT_CLINIC_OR_DEPARTMENT_OTHER)
Admission: EM | Admit: 2018-03-02 | Discharge: 2018-03-03 | Disposition: A | Discharge status: Home / Self Care | Payer: 59 | Source: Home / Self Care | Attending: Emergency Medicine | Admitting: Emergency Medicine

## 2018-03-02 ENCOUNTER — Encounter (HOSPITAL_BASED_OUTPATIENT_CLINIC_OR_DEPARTMENT_OTHER): Payer: Self-pay

## 2018-03-02 DIAGNOSIS — N201 Calculus of ureter: Secondary | ICD-10-CM | POA: Insufficient documentation

## 2018-03-02 DIAGNOSIS — N202 Calculus of kidney with calculus of ureter: Secondary | ICD-10-CM | POA: Diagnosis present

## 2018-03-02 DIAGNOSIS — Z79899 Other long term (current) drug therapy: Secondary | ICD-10-CM

## 2018-03-02 DIAGNOSIS — Z87442 Personal history of urinary calculi: Secondary | ICD-10-CM | POA: Diagnosis not present

## 2018-03-02 DIAGNOSIS — Z6841 Body Mass Index (BMI) 40.0 and over, adult: Secondary | ICD-10-CM | POA: Diagnosis not present

## 2018-03-02 DIAGNOSIS — N132 Hydronephrosis with renal and ureteral calculous obstruction: Secondary | ICD-10-CM | POA: Diagnosis not present

## 2018-03-02 LAB — PREGNANCY, URINE: Preg Test, Ur: NEGATIVE

## 2018-03-02 LAB — URINALYSIS, MICROSCOPIC (REFLEX)

## 2018-03-02 LAB — URINALYSIS, ROUTINE W REFLEX MICROSCOPIC
GLUCOSE, UA: NEGATIVE mg/dL
Ketones, ur: 15 mg/dL — AB
Nitrite: NEGATIVE
PH: 7.5 (ref 5.0–8.0)
Protein, ur: 30 mg/dL — AB
SPECIFIC GRAVITY, URINE: 1.02 (ref 1.005–1.030)

## 2018-03-02 MED ORDER — ONDANSETRON HCL 4 MG/2ML IJ SOLN
4.0000 mg | Freq: Once | INTRAMUSCULAR | Status: AC
  Administered 2018-03-02: 4 mg via INTRAVENOUS
  Filled 2018-03-02: qty 2

## 2018-03-02 MED ORDER — SODIUM CHLORIDE 0.9 % IV BOLUS
1000.0000 mL | Freq: Once | INTRAVENOUS | Status: AC
  Administered 2018-03-02: 1000 mL via INTRAVENOUS

## 2018-03-02 MED ORDER — FENTANYL CITRATE (PF) 100 MCG/2ML IJ SOLN
50.0000 ug | INTRAMUSCULAR | Status: AC | PRN
  Administered 2018-03-02 (×2): 50 ug via INTRAVENOUS
  Filled 2018-03-02 (×2): qty 2

## 2018-03-02 MED ORDER — FENTANYL CITRATE (PF) 100 MCG/2ML IJ SOLN
50.0000 ug | Freq: Once | INTRAMUSCULAR | Status: AC
  Administered 2018-03-03: 50 ug via INTRAVENOUS
  Filled 2018-03-02: qty 2

## 2018-03-02 MED ORDER — FENTANYL CITRATE (PF) 100 MCG/2ML IJ SOLN: 100.0000 ug | Freq: Once | INTRAMUSCULAR | Status: DC

## 2018-03-02 NOTE — ED Triage Notes (Signed)
Pt c/o l side flank pain that pt states feels like last time she has had a kidney stone. Pt also c/o abd cramping which was not present last time. Pt also c/o N/V and chills.

## 2018-03-02 NOTE — ED Notes (Signed)
EDP notified of pt's uncontrolled pain.

## 2018-03-02 NOTE — ED Provider Notes (Signed)
[facility] Provider Note: Lowella Dell, MD, FACEP  CSN: [id number] MRN: [id number] ARRIVAL: 03/02/18 at 2219 ROOM: [facility]11/[facility]11   CHIEF COMPLAINT  Flank Pain   HISTORY OF PRESENT ILLNESS  03/02/18 11:38 PM Mindy Davies is a 32 y.o. female with a history of kidney stones.  She is here with left flank pain radiating to her left lower quadrant.  The pain began several hours ago.  She describes the pain as severe and feels like previous kidney stones.  She describes the pain as constant.  It is somewhat worse with movement or palpation.  She has had associated nausea and vomiting.  She has also had chills.  She also says she has abdominal cramping which was not present with previous kidney stones.  She was given Zofran per protocol with relief of her nausea.  She has had 100 mcg of fentanyl with partial relief of her pain.   Past Medical History:  Diagnosis Date  . Kidney stones   . Medical history non-contributory     Past Surgical History:  Procedure Laterality Date  . BUNIONECTOMY    . WISDOM TOOTH EXTRACTION      Family History  Problem Relation Age of Onset  . Cancer Paternal Grandmother     Social History   Tobacco Use  . Smoking status: Never Smoker  . Smokeless tobacco: Never Used  Substance Use Topics  . Alcohol use: Yes  . Drug use: No    Prior to Admission medications   Medication Sig Start Date End Date Taking? Authorizing Provider  ciprofloxacin (CIPRO) 500 MG tablet Take 1 tablet (500 mg total) by mouth 2 (two) times daily. 08/08/16   Benjiman Core, MD  famotidine (PEPCID) 20 MG tablet Take 1 tablet (20 mg total) by mouth 2 (two) times daily. Patient not taking: Reported on 04/14/2015 01/10/15 01/10/16  Katrinka Blazing, [place], CNM  ibuprofen (ADVIL,MOTRIN) 600 MG tablet Take 1 tablet (600 mg total) by mouth every 6 (six) hours. Patient not taking: Reported on 04/14/2015 02/27/15   Araceli Bouche, DO  ondansetron (ZOFRAN) 4 MG tablet Take 1 tablet  (4 mg total) by mouth every 8 (eight) hours as needed for nausea or vomiting. 12/29/15   Trixie Dredge, PA-C  oxyCODONE-acetaminophen (PERCOCET/ROXICET) 5-325 MG tablet Take 1-2 tablets by mouth every 6 (six) hours as needed for severe pain. 08/08/16   Benjiman Core, MD  tamsulosin (FLOMAX) 0.4 MG CAPS capsule Take 1 capsule (0.4 mg total) by mouth daily. 12/29/15   Trixie Dredge, PA-C    Allergies Patient has no known allergies.   REVIEW OF SYSTEMS  Negative except as noted here or in the History of Present Illness.   PHYSICAL EXAMINATION  Initial Vital Signs Height 5\' 7"  (1.702 m), weight 117.9 kg (260 lb), last menstrual period 12/27/2017.  Examination General: Well-developed, well-nourished female in no acute distress; appearance consistent with age of record HENT: normocephalic; atraumatic Eyes: pupils equal, round and reactive to light; extraocular muscles intact; photophobia Neck: supple Heart: regular rate and rhythm Lungs: clear to auscultation bilaterally Abdomen: soft; nondistended; left lower quadrant tenderness; bowel sounds present GU: Left CVA tenderness Extremities: No deformity; full range of motion; pulses normal Neurologic: Awake, alert and oriented; motor function intact in all extremities and symmetric; no facial droop Skin: Warm and dry Psychiatric: Flat affect   RESULTS  Summary of this visit's results, reviewed by myself:   EKG Interpretation  Date/Time:    Ventricular Rate:    PR Interval:  QRS Duration:   QT Interval:    QTC Calculation:   R Axis:     Text Interpretation:        Laboratory Studies: Results for orders placed or performed during the hospital encounter of 03/02/18 (from the past 24 hour(s))  Urinalysis, Routine w reflex microscopic- may I&O cath if menses     Status: Abnormal   Collection Time: 03/02/18 11:40 PM  Result Value Ref Range   Color, Urine BROWN (A) YELLOW   APPearance CLOUDY (A) CLEAR   Specific Gravity, Urine  1.020 1.005 - 1.030   pH 7.5 5.0 - 8.0   Glucose, UA NEGATIVE NEGATIVE mg/dL   Hgb urine dipstick LARGE (A) NEGATIVE   Bilirubin Urine SMALL (A) NEGATIVE   Ketones, ur 15 (A) NEGATIVE mg/dL   Protein, ur 30 (A) NEGATIVE mg/dL   Nitrite NEGATIVE NEGATIVE   Leukocytes, UA SMALL (A) NEGATIVE  Pregnancy, urine     Status: None   Collection Time: 03/02/18 11:40 PM  Result Value Ref Range   Preg Test, Ur NEGATIVE NEGATIVE  Urinalysis, Microscopic (reflex)     Status: Abnormal   Collection Time: 03/02/18 11:40 PM  Result Value Ref Range   RBC / HPF TOO NUMEROUS TO COUNT 0 - 5 RBC/hpf   WBC, UA 6-30 0 - 5 WBC/hpf   Bacteria, UA FEW (A) NONE SEEN   Squamous Epithelial / LPF 0-5 (A) NONE SEEN   Imaging Studies: Ct Renal Stone Study  Result Date: 03/03/2018 CLINICAL DATA:  Severe left flank pain today. Nausea, vomiting, chills, and hematuria. EXAM: CT ABDOMEN AND PELVIS WITHOUT CONTRAST TECHNIQUE: Multidetector CT imaging of the abdomen and pelvis was performed following the standard protocol without IV contrast. COMPARISON:  08/08/2016 FINDINGS: Lower chest: Lung bases are clear. Hepatobiliary: No focal liver abnormality is seen. No gallstones, gallbladder wall thickening, or biliary dilatation. Pancreas: Unremarkable. No pancreatic ductal dilatation or surrounding inflammatory changes. Spleen: Normal in size without focal abnormality. Adrenals/Urinary Tract: No adrenal gland nodules. Multiple bilateral intrarenal stones. Largest measuring about 6 mm diameter. There is a stone in the left ureterovesical junction measuring about 7 mm diameter. There is proximal hydronephrosis and hydroureter with stranding around the left kidney and ureter. The right ureter and collecting system are decompressed. Bladder is decompressed without stones identified. Stomach/Bowel: Stomach is within normal limits. Appendix appears normal. No evidence of bowel wall thickening, distention, or inflammatory changes.  Vascular/Lymphatic: Moderately prominent lymph nodes in the retroperitoneum and periaortic region. Largest or measuring about 9 mm diameter. These are nonspecific but likely to be reactive. Similar pattern was seen previously. Normal caliber abdominal aorta. Reproductive: Uterus and bilateral adnexa are unremarkable. Other: No abdominal wall hernia or abnormality. No abdominopelvic ascites. Musculoskeletal: No acute or significant osseous findings. IMPRESSION: 1. Multiple bilateral nonobstructing intrarenal stones. 2. 6 mm stone in the left ureterovesical junction with moderate proximal obstruction. 3. Nonspecific prominent retroperitoneal lymph nodes, similar to previous study. Nonspecific but likely reactive. Electronically Signed   By: Burman Nieves M.D.   On: 03/03/2018 01:51    ED COURSE  Nursing notes and initial vitals signs, including pulse oximetry, reviewed.  Vitals:   03/02/18 2230 03/03/18 0016 03/03/18 0110  BP:  127/77 (!) 108/58  Pulse:  (!) 53 (!) 53  Resp:  20 16  SpO2:  100% 95%  Weight: 117.9 kg (260 lb)    Height: 5\' 7"  (1.702 m)     2:12 AM Patient sleeping.  On awakening she states her  pain is an 8 out of 10.  We will give an additional dose of fentanyl.  She was advised of the stone in her left UVJ as well as the multiple stones in her kidneys.  We will discharge her home on analgesics and have her follow-up with [facility] as her current stone may not pass on its own.  She does not currently have a urologist.  PROCEDURES    ED DIAGNOSES     ICD-10-CM   1. Ureterolithiasis N20.1        Luvern Mcisaac, Jonny RuizJohn, MD 03/03/18 [phone]

## 2018-03-03 ENCOUNTER — Encounter (HOSPITAL_COMMUNITY)
Admission: AD | Disposition: A | Discharge status: Home / Self Care | Payer: Self-pay | Source: Ambulatory Visit | Attending: Urology

## 2018-03-03 ENCOUNTER — Ambulatory Visit (HOSPITAL_COMMUNITY): Payer: 59 | Admitting: Anesthesiology

## 2018-03-03 ENCOUNTER — Other Ambulatory Visit: Payer: Self-pay | Admitting: Urology

## 2018-03-03 ENCOUNTER — Ambulatory Visit (HOSPITAL_COMMUNITY): Payer: 59

## 2018-03-03 ENCOUNTER — Encounter (HOSPITAL_COMMUNITY): Payer: Self-pay | Admitting: Anesthesiology

## 2018-03-03 ENCOUNTER — Emergency Department (HOSPITAL_BASED_OUTPATIENT_CLINIC_OR_DEPARTMENT_OTHER): Payer: 59

## 2018-03-03 ENCOUNTER — Ambulatory Visit (HOSPITAL_COMMUNITY)
Admission: AD | Admit: 2018-03-03 | Discharge: 2018-03-03 | Disposition: A | Discharge status: Home / Self Care | Payer: 59 | Source: Ambulatory Visit | Attending: Urology | Admitting: Urology

## 2018-03-03 ENCOUNTER — Other Ambulatory Visit: Payer: Self-pay

## 2018-03-03 DIAGNOSIS — Z6841 Body Mass Index (BMI) 40.0 and over, adult: Secondary | ICD-10-CM | POA: Insufficient documentation

## 2018-03-03 DIAGNOSIS — Z87442 Personal history of urinary calculi: Secondary | ICD-10-CM | POA: Insufficient documentation

## 2018-03-03 DIAGNOSIS — N132 Hydronephrosis with renal and ureteral calculous obstruction: Secondary | ICD-10-CM | POA: Insufficient documentation

## 2018-03-03 DIAGNOSIS — N201 Calculus of ureter: Secondary | ICD-10-CM

## 2018-03-03 DIAGNOSIS — N200 Calculus of kidney: Secondary | ICD-10-CM

## 2018-03-03 HISTORY — DX: Personal history of urinary calculi: Z87.442

## 2018-03-03 HISTORY — PX: CYSTOSCOPY WITH RETROGRADE PYELOGRAM, URETEROSCOPY AND STENT PLACEMENT: SHX5789

## 2018-03-03 SURGERY — CYSTOURETEROSCOPY, WITH RETROGRADE PYELOGRAM AND STENT INSERTION
Anesthesia: General | Laterality: Bilateral

## 2018-03-03 MED ORDER — MIDAZOLAM HCL 2 MG/2ML IJ SOLN
INTRAMUSCULAR | Status: AC
  Filled 2018-03-03: qty 2

## 2018-03-03 MED ORDER — MIDAZOLAM HCL 5 MG/5ML IJ SOLN
INTRAMUSCULAR | Status: DC | PRN
  Administered 2018-03-03: 2 mg via INTRAVENOUS

## 2018-03-03 MED ORDER — DEXAMETHASONE SODIUM PHOSPHATE 10 MG/ML IJ SOLN
INTRAMUSCULAR | Status: DC | PRN
  Administered 2018-03-03: 5 mg via INTRAVENOUS

## 2018-03-03 MED ORDER — IOHEXOL 300 MG/ML  SOLN
INTRAMUSCULAR | Status: DC | PRN
  Administered 2018-03-03: 6 mL via URETHRAL

## 2018-03-03 MED ORDER — ONDANSETRON HCL 4 MG/2ML IJ SOLN
INTRAMUSCULAR | Status: DC | PRN
  Administered 2018-03-03: 4 mg via INTRAVENOUS

## 2018-03-03 MED ORDER — METOCLOPRAMIDE HCL 5 MG/ML IJ SOLN: 10.0000 mg | Freq: Once | INTRAMUSCULAR | Status: DC | PRN

## 2018-03-03 MED ORDER — SODIUM CHLORIDE 0.9 % IR SOLN
Status: DC | PRN
  Administered 2018-03-03: 3000 mL via INTRAVESICAL

## 2018-03-03 MED ORDER — LIDOCAINE 2% (20 MG/ML) 5 ML SYRINGE
INTRAMUSCULAR | Status: DC | PRN
  Administered 2018-03-03: 60 mg via INTRAVENOUS

## 2018-03-03 MED ORDER — DEXAMETHASONE SODIUM PHOSPHATE 10 MG/ML IJ SOLN
INTRAMUSCULAR | Status: AC
  Filled 2018-03-03: qty 1

## 2018-03-03 MED ORDER — ONDANSETRON HCL 4 MG/2ML IJ SOLN
INTRAMUSCULAR | Status: AC
  Filled 2018-03-03: qty 2

## 2018-03-03 MED ORDER — CEFAZOLIN SODIUM-DEXTROSE 2-4 GM/100ML-% IV SOLN
2.0000 g | Freq: Once | INTRAVENOUS | Status: AC
  Administered 2018-03-03: 2 g via INTRAVENOUS
  Filled 2018-03-03: qty 100

## 2018-03-03 MED ORDER — LACTATED RINGERS IV SOLN
INTRAVENOUS | Status: DC
  Administered 2018-03-03: 17:00:00 via INTRAVENOUS

## 2018-03-03 MED ORDER — ONDANSETRON 8 MG PO TBDP: 8.0000 mg | ORAL_TABLET | Freq: Three times a day (TID) | ORAL | 0 refills | Status: DC | PRN

## 2018-03-03 MED ORDER — FENTANYL CITRATE (PF) 100 MCG/2ML IJ SOLN
INTRAMUSCULAR | Status: AC
  Filled 2018-03-03: qty 2

## 2018-03-03 MED ORDER — PROPOFOL 10 MG/ML IV BOLUS
INTRAVENOUS | Status: DC | PRN
  Administered 2018-03-03: 200 mg via INTRAVENOUS

## 2018-03-03 MED ORDER — FENTANYL CITRATE (PF) 100 MCG/2ML IJ SOLN
50.0000 ug | Freq: Once | INTRAMUSCULAR | Status: AC
  Administered 2018-03-03: 50 ug via INTRAVENOUS
  Filled 2018-03-03: qty 2

## 2018-03-03 MED ORDER — FENTANYL CITRATE (PF) 100 MCG/2ML IJ SOLN
INTRAMUSCULAR | Status: DC | PRN
  Administered 2018-03-03 (×2): 50 ug via INTRAVENOUS

## 2018-03-03 MED ORDER — MEPERIDINE HCL 50 MG/ML IJ SOLN: 6.2500 mg | INTRAMUSCULAR | Status: DC | PRN

## 2018-03-03 MED ORDER — NITROFURANTOIN MONOHYD MACRO 100 MG PO CAPS: 100.0000 mg | ORAL_CAPSULE | Freq: Every day | ORAL | 0 refills | Status: AC

## 2018-03-03 MED ORDER — HYDROMORPHONE HCL 1 MG/ML IJ SOLN: 0.2500 mg | INTRAMUSCULAR | Status: DC | PRN

## 2018-03-03 MED ORDER — HYDROMORPHONE HCL 2 MG PO TABS: 2.0000 mg | ORAL_TABLET | ORAL | 0 refills | Status: DC | PRN

## 2018-03-03 MED ORDER — PROPOFOL 10 MG/ML IV BOLUS
INTRAVENOUS | Status: AC
  Filled 2018-03-03: qty 20

## 2018-03-03 SURGICAL SUPPLY — 21 items
BAG URO CATCHER STRL LF (MISCELLANEOUS) ×3 IMPLANT
BASKET ZERO TIP NITINOL 2.4FR (BASKET) IMPLANT
CATH INTERMIT  6FR 70CM (CATHETERS) ×3 IMPLANT
CATH URET 5FR 28IN CONE TIP (BALLOONS)
CATH URET 5FR 70CM CONE TIP (BALLOONS) IMPLANT
CATH URET WHISTLE 6FR (CATHETERS) IMPLANT
CLOTH BEACON ORANGE TIMEOUT ST (SAFETY) ×3 IMPLANT
COVER FOOTSWITCH UNIV (MISCELLANEOUS) ×3 IMPLANT
COVER SURGICAL LIGHT HANDLE (MISCELLANEOUS) ×3 IMPLANT
FIBER LASER TRAC TIP (UROLOGICAL SUPPLIES) ×3 IMPLANT
GLOVE BIO SURGEON STRL SZ7.5 (GLOVE) ×9 IMPLANT
GOWN STRL REUS W/TWL XL LVL3 (GOWN DISPOSABLE) ×6 IMPLANT
GUIDEWIRE STR DUAL SENSOR (WIRE) ×3 IMPLANT
MANIFOLD NEPTUNE II (INSTRUMENTS) ×3 IMPLANT
PACK CYSTO (CUSTOM PROCEDURE TRAY) ×3 IMPLANT
SHEATH ACCESS URETERAL 24CM (SHEATH) IMPLANT
SHEATH URETERAL 12FRX35CM (MISCELLANEOUS) IMPLANT
STENT CONTOUR 6FRX24X.038 (STENTS) ×6 IMPLANT
TUBING CONNECTING 10 (TUBING) ×2 IMPLANT
TUBING CONNECTING 10' (TUBING) ×1
WIRE COONS/BENSON .038X145CM (WIRE) IMPLANT

## 2018-03-03 NOTE — Discharge Instructions (Signed)
Ureteral Stent Implantation, Care After Refer to this sheet in the next few weeks. These instructions provide you with information about caring for yourself after your procedure. Your health care provider may also give you more specific instructions. Your treatment has been planned according to current medical practices, but problems sometimes occur. Call your health care provider if you have any problems or questions after your procedure.  Be sure to call Dr. Estil DaftEskridge's office to get an appointment for follow-up removal of stents/stones in both kidneys.  What can I expect after the procedure? After the procedure, it is common to have:  Nausea.  Mild pain when you urinate. You may feel this pain in your lower back or lower abdomen. Pain should stop within a few minutes after you urinate. This may last for up to 1 week.  A small amount of blood in your urine for several days.  Follow these instructions at home:  Medicines  Take over-the-counter and prescription medicines only as told by your health care provider.  If you were prescribed an antibiotic medicine, take it as told by your health care provider. Do not stop taking the antibiotic even if you start to feel better.  Do not drive for 24 hours if you received a sedative.  Do not drive or operate heavy machinery while taking prescription pain medicines. Activity  Return to your normal activities as told by your health care provider. Ask your health care provider what activities are safe for you.  Do not lift anything that is heavier than 10 lb (4.5 kg). Follow this limit for 1 week after your procedure, or for as long as told by your health care provider. General instructions  Watch for any blood in your urine. Call your health care provider if the amount of blood in your urine increases.  If you have a catheter: ? Follow instructions from your health care provider about taking care of your catheter and collection bag. ? Do not  take baths, swim, or use a hot tub until your health care provider approves.  Drink enough fluid to keep your urine clear or pale yellow.  Keep all follow-up visits as told by your health care provider. This is important. Contact a health care provider if:  You have pain that gets worse or does not get better with medicine, especially pain when you urinate.  You have difficulty urinating.  You feel nauseous or you vomit repeatedly during a period of more than 2 days after the procedure. Get help right away if:  Your urine is dark red or has blood clots in it.  You are leaking urine (have incontinence).  The end of the stent comes out of your urethra.  You cannot urinate.  You have sudden, sharp, or severe pain in your abdomen or lower back.  You have a fever. This information is not intended to replace advice given to you by your health care provider. Make sure you discuss any questions you have with your health care provider. Document Released: 07/18/2013 Document Revised: 04/22/2016 Document Reviewed: 05/30/2015 Elsevier Interactive Patient Education  [employer]2018 [employer]

## 2018-03-03 NOTE — Anesthesia Preprocedure Evaluation (Addendum)
Anesthesia Evaluation  Patient identified by MRN, date of birth, ID band Patient awake    Reviewed: Allergy & Precautions, NPO status , Patient's Chart, lab work & pertinent test results  Airway Mallampati: III  TM Distance: >3 FB Neck ROM: Full    Dental no notable dental hx. (+) Teeth Intact   Pulmonary neg pulmonary ROS,    Pulmonary exam normal breath sounds clear to auscultation       Cardiovascular negative cardio ROS Normal cardiovascular exam Rhythm:Regular Rate:Normal     Neuro/Psych negative neurological ROS  negative psych ROS   GI/Hepatic negative GI ROS, Neg liver ROS,   Endo/Other  Morbid obesity  Renal/GU Renal diseaseBilateral renal stones, Left ureteral stone  negative genitourinary   Musculoskeletal negative musculoskeletal ROS (+)   Abdominal (+) + obese,   Peds  Hematology negative hematology ROS (+)   Anesthesia Other Findings   Reproductive/Obstetrics                            Anesthesia Physical Anesthesia Plan  ASA: III  Anesthesia Plan: General   Post-op Pain Management:    Induction: Intravenous  PONV Risk Score and Plan: Midazolam, Dexamethasone, Ondansetron and Treatment may vary due to age or medical condition  Airway Management Planned: LMA  Additional Equipment:   Intra-op Plan:   Post-operative Plan: Extubation in OR  Informed Consent: I have reviewed the patients History and Physical, chart, labs and discussed the procedure including the risks, benefits and alternatives for the proposed anesthesia with the patient or authorized representative who has indicated his/her understanding and acceptance.   Dental advisory given  Plan Discussed with: Anesthesiologist, CRNA and Surgeon  Anesthesia Plan Comments:         Anesthesia Quick Evaluation

## 2018-03-03 NOTE — H&P (View-Only) (Signed)
Office Visit Report     03/03/2018   --------------------------------------------------------------------------------   Mindy Davies  MRN: [id number]  PRIMARY CARE:    DOB: Jun 05, 1986, 32 year old Female  REFERRING:    SSN:   PROVIDER:  Jerilee FieldMatthew Marcille Barman, M.D.    LOCATION:  [facility] [phone]   --------------------------------------------------------------------------------   CC: I have ureteral stone.  HPI: Mindy Davies is a 32 year-old female patient who is here for ureteral stone.  The problem is on the left side. She first stated noticing pain on approximately 03/02/2018. This is not her first kidney stone. She is currently having flank pain. She denies having back pain, groin pain, nausea, vomiting, fever, and chills. Pain is occuring on the left side.   She has had eswl for treatment of her stones in the past.   She underwent a CT scan March 03, 2018 which revealed nephrocalcinosis, small bilateral stones and a 7 mm left ureterovesical junction stone (visible on scout). She had too numerous to count red blood cells, 6-30 white cells and a few bacteria. No culture, wbc or bmp sent. She's staying well hydrated. She has significant pain currently. She received fentanyl in the emergency and no NSAIDs. I reviewed the MAR. No fever. AFVSS. She is still in quite a bit of pain after Toradol and the stone is stationary at the left distal ureter.   KUB reveals the stone at the left UVJ. She also has scattered bilateral stones with a 5 mm left midpole and 9 mm right lower pole stone as the largest.   She has a long h/o stones. She underwent ESWL several years ago and passed a rt stone in 2007.     ALLERGIES: None   MEDICATIONS: None   GU PSH: None     PSH Notes: Bunion removal (both feet)   Wisdom teeth extraction   NON-GU PSH: Dental Surgery Procedure    GU PMH: Renal calculus    NON-GU PMH: None   FAMILY HISTORY: None    Notes: 1 daughter   SOCIAL  HISTORY: Marital Status: Single Preferred Language: English; Ethnicity: Not Hispanic Or Latino; Race: Black or African American Current Smoking Status: Patient has never smoked.   Tobacco Use Assessment Completed: Used Tobacco in last 30 days? Does not use smokeless tobacco. Drinks 1 drink per day.  Drinks 1 caffeinated drink per day. 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    REVIEW OF SYSTEMS:    GU Review Female:   Patient denies frequent urination, hard to postpone urination, burning /pain with urination, get up at night to urinate, leakage of urine, stream starts and stops, trouble starting your stream, have to strain to urinate, and being pregnant.  Gastrointestinal (Upper):   Patient reports nausea and vomiting. Patient denies indigestion/ heartburn.  Gastrointestinal (Lower):   Patient denies constipation and diarrhea.  Constitutional:   Patient reports night sweats and weight loss. Patient denies fever and fatigue.  Skin:   Patient denies skin rash/ lesion and itching.  Eyes:   Patient denies blurred vision and double vision.  Ears/ Nose/ Throat:   Patient denies sore throat and sinus problems.  Hematologic/Lymphatic:   Patient denies swollen glands and easy bruising.  Cardiovascular:   Patient denies leg swelling and chest pains.  Respiratory:   Patient denies cough and shortness of breath.  Endocrine:   Patient denies excessive thirst.  Musculoskeletal:   Patient denies back pain and joint pain.  Neurological:   Patient reports dizziness. Patient  denies headaches.  Psychologic:   Patient denies depression and anxiety.   VITAL SIGNS:      03/03/2018 10:08 AM  Weight 260 lb / 117.93 kg  Height 67 in / 170.18 cm  BP 107/71 mmHg  Pulse 59 /min  Temperature 98.7 F / 37.0 C  BMI 40.7 kg/m   MULTI-SYSTEM PHYSICAL EXAMINATION:    Constitutional: Well-nourished. No physical deformities. Normally developed. Good grooming.  Neck: Neck symmetrical, not swollen. Normal  tracheal position.  Respiratory: No labored breathing, no use of accessory muscles. Normal breath sounds.  Cardiovascular: Regular rate and rhythm. Normal temperature, normal extremity pulses, no swelling, no varicosities.   Skin: No paleness, no jaundice, no cyanosis. No lesion, no ulcer, no rash.  Neurologic / Psychiatric: Oriented to time, oriented to place, oriented to person. No depression, no anxiety, no agitation.  Gastrointestinal: No mass, no tenderness, no rigidity, non obese abdomen.  Eyes: Normal conjunctivae. Normal eyelids.  Ears, Nose, Mouth, and Throat: Left ear no scars, no lesions, no masses. Right ear no scars, no lesions, no masses. Nose no scars, no lesions, no masses. Normal hearing. Normal lips.  Musculoskeletal: Normal gait and station of head and neck.     PAST DATA REVIEWED:  Source Of History:  Patient   PROCEDURES:         KUB - [id number]  A single view of the abdomen is obtained.  Calculi:  10 mm stone at the left UVJ. She also has scattered bilateral stones with a 5 mm left midpole and 9 mm right lower pole stone as the largest.      The bones appeared normal. The bowel gas pattern appeared normal. The soft tissues were unremarkable.         Ketoralac 60mg  - [id number], [id number] Qty: 60 Adm. By: Kandace Parkins  Unit: mg Lot No [id number]  Route: IM Exp. Date 10/18/2018  Freq: None Mfgr.:   Site: Right Buttock   ASSESSMENT:      ICD-10 Details  1 GU:   Ureteral calculus - N20.1   2   Renal calculus - N20.0    PLAN:           Orders X-Rays: KUB          Schedule Return Visit/Planned Activity: Next Available Appointment - Schedule Surgery          Document Letter(s):  Created for Patient: Clinical Summary         Notes:   left ureteral stone, distal, and multiple stones in the kidneys -- she was given Toradol 60 mg IM and still has quite a bit of pain. Also, operating room without the ability to do laser tomorrow given power outage. I drew a picture for the  patient and her friend and we went over the nature risks benefits and alternatives to continued stone passage, surveillance, left ureteroscopy, or shockwave lithotripsy. All questions answered. She would like to proceed with ureteroscopy which is reasonable. Given the bilateral stone burden she also asked about treating the renal stones. There are multiple renal stones and this would likely need a staged procedure. We discussed pre-stenting the ureters to bring her back for that in a few weeks and she elects to proceed.   cc: Dr. Read Drivers     * Signed by Jerilee Field, M.D. on 03/03/18 at 4:25 PM (EDT)*     The information contained in this medical record document is considered private and confidential patient information. This information can only be used  for the medical diagnosis and/or medical services that are being provided by the patient's selected caregivers. This information can only be distributed outside of the patient's care if the patient agrees and signs waivers of authorization for this information to be sent to an outside source or route.

## 2018-03-03 NOTE — Transfer of Care (Signed)
Immediate Anesthesia Transfer of Care Note  Patient: Julius Godbey  Procedure(s) Performed: CYSTOSCOPY WITH BILATERAL RETROGRADE PYELOGRAM, LEFT  URETEROSCOPY AND BILATERAL STENT PLACEMENT WITH HOLMIUM LASER (Bilateral )  Patient Location: PACU  Anesthesia Type:General  Level of Consciousness: drowsy and patient cooperative  Airway & Oxygen Therapy: Patient Spontanous Breathing and Patient connected to face mask oxygen  Post-op Assessment: Report given to RN and Post -op Vital signs reviewed and stable  Post vital signs: Reviewed and stable  Last Vitals:  Vitals Value Taken Time  BP    Temp    Pulse 55 03/03/2018  6:40 PM  Resp 25 03/03/2018  6:40 PM  SpO2 100 % 03/03/2018  6:40 PM  Vitals shown include unvalidated device data.  Last Pain:  Vitals:   03/03/18 1640  TempSrc:   PainSc: 4       Patients Stated Pain Goal: 4 (03/03/18 1640)  Complications: No apparent anesthesia complications

## 2018-03-03 NOTE — Op Note (Addendum)
Preoperative diagnosis: Left ureteral stone, bilateral renal stones Postoperative diagnosis: Same  Procedure: Cystoscopy with left retrograde pyelogram, left ureteroscopy, holmium laser lithotripsy, stone basket extraction, left ureteral stent placement; right retrograde pyelogram, right ureteral stent placement -- STAGED  Surgeon: Mena GoesEskridge  Anesthesia: General  Indication for procedure: 32 year old with large left distal stone with uncontrolled pain.  She also had significant stone burden of both kidneys and because she was going to the operating room for urgent distal left URS, elected to undergo pre-stenting for follow-up bilateral renoscopy to clear the kidneys.  Findings: On cystoscopy there was no stone or foreign body in the bladder and the bladder was unremarkable.    Left retrograde pyelogram-this outlined a single ureter single collecting system unit with a large filling defect in the left distal ureter consistent with the stone.  There was proximal hydroureteronephrosis.  No other filling defects.  Right retrograde pyelogram-this outlined a single ureter single collecting system unit without filling defect, stricture or dilation.  Description of procedure: After consent was obtained the patient brought to the operating room.  After adequate anesthesia she was placed in lithotomy position and prepped and draped in usual sterile fashion.  A timeout was performed to confirm the patient and procedure.  The cystoscope was passed per urethra and the bladder inspected.  A left retrograde was performed by cannulating the left ureteral orifice was a 6 [place] open-ended catheter and retrograde injection of contrast was performed.  There was significant obstruction by the distal stone and I could not get contrast up the proximal ureter.  Therefore the wire was advanced and the 6 [place] advanced with some resistance to the mid ureter and the wire removed.  There was a brisk hydronephrotic drip, but  the urine was clear.  Retrograde confirmed the collecting system and luminal placement and the wire was replaced.  The semirigid ureteroscope was advanced adjacent to the wire and the stone was noted.  It was fragmented at 0.3 and 50.  Several small fragments were cleared with the 0 tip basket into the bladder.  The wire was backloaded on the cystoscope and a 6 x 24 cm stent was advanced.  A good coil was seen in the renal pelvis and a good coil in the bladder.  Attention was then turned to the right side with the cystoscope and a 6 [place] open-ended catheter used to cannulate the right ureteral orifice and retrograde injection of contrast was performed.  A wire was advanced into the kidney and the 6 [place] open-ended removed.  A 6 x 24 cm stent was placed on the right side.  A good coil was seen in the kidney and a good coil in the bladder.  The bladder and stone fragments were drained and the scope removed she was awakened and taken to the recovery room in stable condition.  Complications: None  Blood loss: Minimal  Specimens: Stone fragments office lab  Drains: Bilateral 6 x 24 cm ureteral stents  Disposition: Patient stable to PACU

## 2018-03-03 NOTE — Anesthesia Postprocedure Evaluation (Signed)
Anesthesia Post Note  Patient: Brewing technologistChantel Vint  Procedure(s) Performed: CYSTOSCOPY WITH BILATERAL RETROGRADE PYELOGRAM, LEFT  URETEROSCOPY AND BILATERAL STENT PLACEMENT WITH HOLMIUM LASER (Bilateral )     Patient location during evaluation: PACU Anesthesia Type: General Level of consciousness: awake and alert and oriented Pain management: pain level controlled Vital Signs Assessment: post-procedure vital signs reviewed and stable Respiratory status: spontaneous breathing, nonlabored ventilation and respiratory function stable Cardiovascular status: blood pressure returned to baseline and stable Postop Assessment: no apparent nausea or vomiting Anesthetic complications: no    Last Vitals:  Vitals:   03/03/18 1840 03/03/18 1842  BP: 120/76 120/63  Pulse: (!) 55 68  Resp: (!) 25 (!) 25  Temp: 37.1 C   SpO2: 100% 100%    Last Pain:  Vitals:   03/03/18 1840  TempSrc:   PainSc: Asleep                 Jahaan Vanwagner A.

## 2018-03-03 NOTE — H&P (Signed)
Office Visit Report     03/03/2018   --------------------------------------------------------------------------------   Mindy Davies  MRN: [id number]  PRIMARY CARE:    DOB: Jun 05, 1986, 32 year old Female  REFERRING:    SSN:   PROVIDER:  Jerilee FieldMatthew Eleina Jergens, M.D.    LOCATION:  [facility] [phone]   --------------------------------------------------------------------------------   CC: I have ureteral stone.  HPI: Mindy SaxChantel Weisse is a 32 year-old female patient who is here for ureteral stone.  The problem is on the left side. She first stated noticing pain on approximately 03/02/2018. This is not her first kidney stone. She is currently having flank pain. She denies having back pain, groin pain, nausea, vomiting, fever, and chills. Pain is occuring on the left side.   She has had eswl for treatment of her stones in the past.   She underwent a CT scan March 03, 2018 which revealed nephrocalcinosis, small bilateral stones and a 7 mm left ureterovesical junction stone (visible on scout). She had too numerous to count red blood cells, 6-30 white cells and a few bacteria. No culture, wbc or bmp sent. She's staying well hydrated. She has significant pain currently. She received fentanyl in the emergency and no NSAIDs. I reviewed the MAR. No fever. AFVSS. She is still in quite a bit of pain after Toradol and the stone is stationary at the left distal ureter.   KUB reveals the stone at the left UVJ. She also has scattered bilateral stones with a 5 mm left midpole and 9 mm right lower pole stone as the largest.   She has a long h/o stones. She underwent ESWL several years ago and passed a rt stone in 2007.     ALLERGIES: None   MEDICATIONS: None   GU PSH: None     PSH Notes: Bunion removal (both feet)   Wisdom teeth extraction   NON-GU PSH: Dental Surgery Procedure    GU PMH: Renal calculus    NON-GU PMH: None   FAMILY HISTORY: None    Notes: 1 daughter   SOCIAL  HISTORY: Marital Status: Single Preferred Language: English; Ethnicity: Not Hispanic Or Latino; Race: Black or African American Current Smoking Status: Patient has never smoked.   Tobacco Use Assessment Completed: Used Tobacco in last 30 days? Does not use smokeless tobacco. Drinks 1 drink per day.  Drinks 1 caffeinated drink per day. 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    REVIEW OF SYSTEMS:    GU Review Female:   Patient denies frequent urination, hard to postpone urination, burning /pain with urination, get up at night to urinate, leakage of urine, stream starts and stops, trouble starting your stream, have to strain to urinate, and being pregnant.  Gastrointestinal (Upper):   Patient reports nausea and vomiting. Patient denies indigestion/ heartburn.  Gastrointestinal (Lower):   Patient denies constipation and diarrhea.  Constitutional:   Patient reports night sweats and weight loss. Patient denies fever and fatigue.  Skin:   Patient denies skin rash/ lesion and itching.  Eyes:   Patient denies blurred vision and double vision.  Ears/ Nose/ Throat:   Patient denies sore throat and sinus problems.  Hematologic/Lymphatic:   Patient denies swollen glands and easy bruising.  Cardiovascular:   Patient denies leg swelling and chest pains.  Respiratory:   Patient denies cough and shortness of breath.  Endocrine:   Patient denies excessive thirst.  Musculoskeletal:   Patient denies back pain and joint pain.  Neurological:   Patient reports dizziness. Patient  denies headaches.  Psychologic:   Patient denies depression and anxiety.   VITAL SIGNS:      03/03/2018 10:08 AM  Weight 260 lb / 117.93 kg  Height 67 in / 170.18 cm  BP 107/71 mmHg  Pulse 59 /min  Temperature 98.7 F / 37.0 C  BMI 40.7 kg/m   MULTI-SYSTEM PHYSICAL EXAMINATION:    Constitutional: Well-nourished. No physical deformities. Normally developed. Good grooming.  Neck: Neck symmetrical, not swollen. Normal  tracheal position.  Respiratory: No labored breathing, no use of accessory muscles. Normal breath sounds.  Cardiovascular: Regular rate and rhythm. Normal temperature, normal extremity pulses, no swelling, no varicosities.   Skin: No paleness, no jaundice, no cyanosis. No lesion, no ulcer, no rash.  Neurologic / Psychiatric: Oriented to time, oriented to place, oriented to person. No depression, no anxiety, no agitation.  Gastrointestinal: No mass, no tenderness, no rigidity, non obese abdomen.  Eyes: Normal conjunctivae. Normal eyelids.  Ears, Nose, Mouth, and Throat: Left ear no scars, no lesions, no masses. Right ear no scars, no lesions, no masses. Nose no scars, no lesions, no masses. Normal hearing. Normal lips.  Musculoskeletal: Normal gait and station of head and neck.     PAST DATA REVIEWED:  Source Of History:  Patient   PROCEDURES:         KUB - [id number]  A single view of the abdomen is obtained.  Calculi:  10 mm stone at the left UVJ. She also has scattered bilateral stones with a 5 mm left midpole and 9 mm right lower pole stone as the largest.      The bones appeared normal. The bowel gas pattern appeared normal. The soft tissues were unremarkable.         Ketoralac 60mg  - [id number], [id number] Qty: 60 Adm. By: Kandace Parkins  Unit: mg Lot No [id number]  Route: IM Exp. Date 10/18/2018  Freq: None Mfgr.:   Site: Right Buttock   ASSESSMENT:      ICD-10 Details  1 GU:   Ureteral calculus - N20.1   2   Renal calculus - N20.0    PLAN:           Orders X-Rays: KUB          Schedule Return Visit/Planned Activity: Next Available Appointment - Schedule Surgery          Document Letter(s):  Created for Patient: Clinical Summary         Notes:   left ureteral stone, distal, and multiple stones in the kidneys -- she was given Toradol 60 mg IM and still has quite a bit of pain. Also, operating room without the ability to do laser tomorrow given power outage. I drew a picture for the  patient and her friend and we went over the nature risks benefits and alternatives to continued stone passage, surveillance, left ureteroscopy, or shockwave lithotripsy. All questions answered. She would like to proceed with ureteroscopy which is reasonable. Given the bilateral stone burden she also asked about treating the renal stones. There are multiple renal stones and this would likely need a staged procedure. We discussed pre-stenting the ureters to bring her back for that in a few weeks and she elects to proceed.   cc: Dr. Read Drivers     * Signed by Jerilee Field, M.D. on 03/03/18 at 4:25 PM (EDT)*     The information contained in this medical record document is considered private and confidential patient information. This information can only be used  for the medical diagnosis and/or medical services that are being provided by the patient's selected caregivers. This information can only be distributed outside of the patient's care if the patient agrees and signs waivers of authorization for this information to be sent to an outside source or route.

## 2018-03-03 NOTE — ED Notes (Signed)
Patient educated about not driving or performing other critical tasks (such as operating heavy machinery, caring for infant/toddler/child) due to sedative nature of narcotic medications received while in the ED.  Pt/caregiver verbalized understanding.   

## 2018-03-03 NOTE — Anesthesia Procedure Notes (Signed)
Procedure Name: LMA Insertion Performed by: Linzy Darling J, CRNA Pre-anesthesia Checklist: Patient identified, Emergency Drugs available, Suction available, Patient being monitored and Timeout performed Patient Re-evaluated:Patient Re-evaluated prior to induction Oxygen Delivery Method: Circle system utilized Preoxygenation: Pre-oxygenation with 100% oxygen Induction Type: IV induction Ventilation: Mask ventilation without difficulty LMA: LMA inserted LMA Size: 4.0 Number of attempts: 1 Placement Confirmation: positive ETCO2,  CO2 detector and breath sounds checked- equal and bilateral Tube secured with: Tape Dental Injury: Teeth and Oropharynx as per pre-operative assessment        

## 2018-03-04 ENCOUNTER — Encounter (HOSPITAL_COMMUNITY): Payer: Self-pay | Admitting: Urology

## 2018-03-07 ENCOUNTER — Encounter (HOSPITAL_COMMUNITY): Payer: Self-pay | Admitting: Urology

## 2018-03-13 ENCOUNTER — Other Ambulatory Visit: Payer: Self-pay

## 2018-03-13 ENCOUNTER — Other Ambulatory Visit: Payer: Self-pay | Admitting: Urology

## 2018-03-13 ENCOUNTER — Encounter (HOSPITAL_BASED_OUTPATIENT_CLINIC_OR_DEPARTMENT_OTHER): Payer: Self-pay | Admitting: *Deleted

## 2018-03-13 NOTE — Progress Notes (Signed)
SPOKE W/ PT VIA PHONE FOR PRE-OP INTERVIEW.  NPO AFTER MN W/ EXCEPTION CLEAR LIQUIDS UNTIL 0715 (NO CREAM/ MILK PRODUCTS).  ARRIVE AT 1115.  NEGATIVE PREG. RESULT DATED 03-03-2018 W/ CHART AND Epic.  MAY TAKE TRAMADOL/ ZOFRAN IF NEEDED AM DOS W/ SIPS OF WATER.

## 2018-03-14 ENCOUNTER — Ambulatory Visit (HOSPITAL_BASED_OUTPATIENT_CLINIC_OR_DEPARTMENT_OTHER)
Admission: RE | Admit: 2018-03-14 | Discharge: 2018-03-14 | Disposition: A | Discharge status: Home / Self Care | Payer: 59 | Source: Ambulatory Visit | Attending: Urology | Admitting: Urology

## 2018-03-14 ENCOUNTER — Encounter (HOSPITAL_BASED_OUTPATIENT_CLINIC_OR_DEPARTMENT_OTHER): Payer: Self-pay

## 2018-03-14 ENCOUNTER — Ambulatory Visit (HOSPITAL_BASED_OUTPATIENT_CLINIC_OR_DEPARTMENT_OTHER): Payer: 59 | Admitting: Anesthesiology

## 2018-03-14 ENCOUNTER — Encounter (HOSPITAL_BASED_OUTPATIENT_CLINIC_OR_DEPARTMENT_OTHER)
Admission: RE | Disposition: A | Discharge status: Home / Self Care | Payer: Self-pay | Source: Ambulatory Visit | Attending: Urology

## 2018-03-14 ENCOUNTER — Other Ambulatory Visit: Payer: Self-pay

## 2018-03-14 DIAGNOSIS — Z6839 Body mass index (BMI) 39.0-39.9, adult: Secondary | ICD-10-CM | POA: Insufficient documentation

## 2018-03-14 DIAGNOSIS — N202 Calculus of kidney with calculus of ureter: Secondary | ICD-10-CM | POA: Insufficient documentation

## 2018-03-14 DIAGNOSIS — N201 Calculus of ureter: Secondary | ICD-10-CM | POA: Diagnosis present

## 2018-03-14 DIAGNOSIS — N200 Calculus of kidney: Secondary | ICD-10-CM

## 2018-03-14 HISTORY — DX: Calculus of kidney: N20.0

## 2018-03-14 HISTORY — DX: Urgency of urination: R39.15

## 2018-03-14 HISTORY — DX: Polycystic ovarian syndrome: E28.2

## 2018-03-14 HISTORY — PX: CYSTOSCOPY/URETEROSCOPY/HOLMIUM LASER/STENT PLACEMENT: SHX6546

## 2018-03-14 HISTORY — DX: Presence of spectacles and contact lenses: Z97.3

## 2018-03-14 LAB — POCT PREGNANCY, URINE: Preg Test, Ur: NEGATIVE

## 2018-03-14 SURGERY — CYSTOSCOPY/URETEROSCOPY/HOLMIUM LASER/STENT PLACEMENT
Anesthesia: General | Site: Ureter | Laterality: Bilateral

## 2018-03-14 MED ORDER — SODIUM CHLORIDE 0.9 % IR SOLN
Status: DC | PRN
  Administered 2018-03-14: 6000 mL via INTRAVESICAL

## 2018-03-14 MED ORDER — PROPOFOL 10 MG/ML IV BOLUS
INTRAVENOUS | Status: DC | PRN
  Administered 2018-03-14: 20 mg via INTRAVENOUS
  Administered 2018-03-14: 200 mg via INTRAVENOUS

## 2018-03-14 MED ORDER — FENTANYL CITRATE (PF) 100 MCG/2ML IJ SOLN
25.0000 ug | INTRAMUSCULAR | Status: DC | PRN
  Filled 2018-03-14: qty 1

## 2018-03-14 MED ORDER — FENTANYL CITRATE (PF) 100 MCG/2ML IJ SOLN
INTRAMUSCULAR | Status: AC
  Filled 2018-03-14: qty 2

## 2018-03-14 MED ORDER — FENTANYL CITRATE (PF) 100 MCG/2ML IJ SOLN
INTRAMUSCULAR | Status: DC | PRN
  Administered 2018-03-14 (×2): 50 ug via INTRAVENOUS

## 2018-03-14 MED ORDER — ONDANSETRON HCL 4 MG/2ML IJ SOLN
INTRAMUSCULAR | Status: DC | PRN
  Administered 2018-03-14: 4 mg via INTRAVENOUS

## 2018-03-14 MED ORDER — LACTATED RINGERS IV SOLN
INTRAVENOUS | Status: DC
  Administered 2018-03-14 (×2): via INTRAVENOUS
  Filled 2018-03-14: qty 1000

## 2018-03-14 MED ORDER — PROPOFOL 10 MG/ML IV BOLUS
INTRAVENOUS | Status: AC
  Filled 2018-03-14: qty 40

## 2018-03-14 MED ORDER — ONDANSETRON HCL 4 MG/2ML IJ SOLN
INTRAMUSCULAR | Status: AC
  Filled 2018-03-14: qty 2

## 2018-03-14 MED ORDER — ONDANSETRON HCL 4 MG/2ML IJ SOLN
4.0000 mg | Freq: Once | INTRAMUSCULAR | Status: DC | PRN
  Filled 2018-03-14: qty 2

## 2018-03-14 MED ORDER — LIDOCAINE HCL 2 % EX GEL
CUTANEOUS | Status: DC | PRN
  Administered 2018-03-14: 1 via URETHRAL

## 2018-03-14 MED ORDER — OXYCODONE HCL 5 MG PO TABS
5.0000 mg | ORAL_TABLET | Freq: Once | ORAL | Status: DC | PRN
  Filled 2018-03-14: qty 1

## 2018-03-14 MED ORDER — ACETAMINOPHEN 160 MG/5ML PO SOLN
325.0000 mg | ORAL | Status: DC | PRN
  Filled 2018-03-14: qty 20.3

## 2018-03-14 MED ORDER — ACETAMINOPHEN 325 MG PO TABS
325.0000 mg | ORAL_TABLET | ORAL | Status: DC | PRN
  Filled 2018-03-14: qty 2

## 2018-03-14 MED ORDER — DEXAMETHASONE SODIUM PHOSPHATE 10 MG/ML IJ SOLN
INTRAMUSCULAR | Status: AC
  Filled 2018-03-14: qty 1

## 2018-03-14 MED ORDER — KETOROLAC TROMETHAMINE 30 MG/ML IJ SOLN
INTRAMUSCULAR | Status: AC
  Filled 2018-03-14: qty 1

## 2018-03-14 MED ORDER — LIDOCAINE 2% (20 MG/ML) 5 ML SYRINGE
INTRAMUSCULAR | Status: DC | PRN
  Administered 2018-03-14: 80 mg via INTRAVENOUS

## 2018-03-14 MED ORDER — MIDAZOLAM HCL 2 MG/2ML IJ SOLN
INTRAMUSCULAR | Status: AC
  Filled 2018-03-14: qty 2

## 2018-03-14 MED ORDER — CEFAZOLIN SODIUM-DEXTROSE 2-4 GM/100ML-% IV SOLN
2.0000 g | Freq: Once | INTRAVENOUS | Status: AC
  Administered 2018-03-14: 2 g via INTRAVENOUS
  Filled 2018-03-14: qty 100

## 2018-03-14 MED ORDER — DEXAMETHASONE SODIUM PHOSPHATE 10 MG/ML IJ SOLN
INTRAMUSCULAR | Status: DC | PRN
  Administered 2018-03-14: 10 mg via INTRAVENOUS

## 2018-03-14 MED ORDER — OXYCODONE HCL 5 MG/5ML PO SOLN
5.0000 mg | Freq: Once | ORAL | Status: DC | PRN
  Filled 2018-03-14: qty 5

## 2018-03-14 MED ORDER — CEFAZOLIN SODIUM-DEXTROSE 2-4 GM/100ML-% IV SOLN
INTRAVENOUS | Status: AC
  Filled 2018-03-14: qty 100

## 2018-03-14 MED ORDER — LIDOCAINE 2% (20 MG/ML) 5 ML SYRINGE
INTRAMUSCULAR | Status: AC
Start: 2018-03-14 — End: 2018-03-14
  Filled 2018-03-14: qty 5

## 2018-03-14 MED ORDER — KETOROLAC TROMETHAMINE 30 MG/ML IJ SOLN
INTRAMUSCULAR | Status: DC | PRN
  Administered 2018-03-14: 30 mg via INTRAVENOUS

## 2018-03-14 MED ORDER — DEXAMETHASONE SODIUM PHOSPHATE 10 MG/ML IJ SOLN
INTRAMUSCULAR | Status: AC
Start: 2018-03-14 — End: 2018-03-14
  Filled 2018-03-14: qty 1

## 2018-03-14 MED ORDER — MEPERIDINE HCL 25 MG/ML IJ SOLN
6.2500 mg | INTRAMUSCULAR | Status: DC | PRN
  Filled 2018-03-14: qty 1

## 2018-03-14 MED ORDER — MIDAZOLAM HCL 2 MG/2ML IJ SOLN
INTRAMUSCULAR | Status: DC | PRN
  Administered 2018-03-14: 2 mg via INTRAVENOUS

## 2018-03-14 SURGICAL SUPPLY — 27 items
BAG DRAIN URO-CYSTO SKYTR STRL (DRAIN) ×3 IMPLANT
BASKET LASER NITINOL 1.9FR (BASKET) IMPLANT
BASKET ZERO TIP NITINOL 2.4FR (BASKET) ×3 IMPLANT
CATH URET 5FR 28IN CONE TIP (BALLOONS)
CATH URET 5FR 28IN OPEN ENDED (CATHETERS) IMPLANT
CATH URET 5FR 70CM CONE TIP (BALLOONS) IMPLANT
CATH URET DUAL LUMEN 6-10FR 50 (CATHETERS) IMPLANT
CLOTH BEACON ORANGE TIMEOUT ST (SAFETY) ×3 IMPLANT
EXTRACTOR STONE 1.7FRX115CM (UROLOGICAL SUPPLIES) ×3 IMPLANT
FIBER LASER FLEXIVA 365 (UROLOGICAL SUPPLIES) IMPLANT
FIBER LASER TRAC TIP (UROLOGICAL SUPPLIES) IMPLANT
GLOVE BIO SURGEON STRL SZ7.5 (GLOVE) ×3 IMPLANT
GOWN STRL REUS W/TWL LRG LVL3 (GOWN DISPOSABLE) ×3 IMPLANT
GOWN STRL REUS W/TWL XL LVL3 (GOWN DISPOSABLE) ×3 IMPLANT
GUIDEWIRE 0.038 PTFE COATED (WIRE) IMPLANT
GUIDEWIRE ANG ZIPWIRE 038X150 (WIRE) ×6 IMPLANT
GUIDEWIRE STR DUAL SENSOR (WIRE) ×3 IMPLANT
INFUSOR MANOMETER BAG 3000ML (MISCELLANEOUS) ×3 IMPLANT
IV NS IRRIG 3000ML ARTHROMATIC (IV SOLUTION) ×3 IMPLANT
KIT TURNOVER CYSTO (KITS) ×3 IMPLANT
MANIFOLD NEPTUNE II (INSTRUMENTS) ×3 IMPLANT
NS IRRIG 500ML POUR BTL (IV SOLUTION) ×3 IMPLANT
PACK CYSTO (CUSTOM PROCEDURE TRAY) ×3 IMPLANT
SHEATH URET ACCESS 12FR/35CM (UROLOGICAL SUPPLIES) ×3 IMPLANT
STENT POLARIS LOOP 6FR X 26 CM (STENTS) ×6 IMPLANT
TUBE CONNECTING 12'X1/4 (SUCTIONS) ×1
TUBE CONNECTING 12X1/4 (SUCTIONS) ×2 IMPLANT

## 2018-03-14 NOTE — Interval H&P Note (Signed)
History and Physical Interval Note:  03/14/2018 1:29 PM  Mindy Davies  has presented today for surgery, with the diagnosis of BILATERAL RENAL STONES  The various methods of treatment have been discussed with the patient and family. After consideration of risks, benefits and other options for treatment, the patient has consented to  Procedure(s) with comments: CYSTOSCOPY/URETEROSCOPY/HOLMIUM LASER/STENT PLACEMENT (Bilateral) - ONLY NEEDS 60 MIN as a surgical intervention . She has a RLP 6 mm stone and 4-5 scattered left stones and bilateral stent. She's been well. Freq and urgency but no fever or hematuria. No dysuria.  The patient's history has been reviewed, patient examined, no change in status, stable for surgery.  I have reviewed the patient's chart and labs.  Questions were answered to the patient's satisfaction.     Jerilee FieldMatthew Kelsie Zaborowski

## 2018-03-14 NOTE — Anesthesia Procedure Notes (Signed)
Procedure Name: LMA Insertion Date/Time: 03/14/2018 1:50 PM Performed by: Tyrone NineSauve, Bunnie Rehberg F, CRNA Pre-anesthesia Checklist: Patient identified, Timeout performed, Emergency Drugs available, Suction available and Patient being monitored Patient Re-evaluated:Patient Re-evaluated prior to induction Oxygen Delivery Method: Circle system utilized Preoxygenation: Pre-oxygenation with 100% oxygen Induction Type: IV induction Ventilation: Mask ventilation without difficulty LMA: LMA inserted Number of attempts: 1 Placement Confirmation: breath sounds checked- equal and bilateral,  CO2 detector and positive ETCO2 Tube secured with: Tape Dental Injury: Teeth and Oropharynx as per pre-operative assessment

## 2018-03-14 NOTE — Op Note (Signed)
Preoperative diagnosis: Bilateral renal stones Postoperative diagnosis: Same  Procedures: Cystoscopy with bilateral ureteroscopy, laser lithotripsy and stent exchange  Surgeon: Mena GoesEskridge  Anesthesia: General  Indication for procedure: 32 year old African-American female who underwent an urgent left distal ureteroscopy last week for uncontrolled pain and a ureteral stone was brought back today for bilateral renoscopy due to multiple bilateral stones.  Findings: Multiple stones were noted.  The largest stone in the right lower pole and in the left mid posterior calyx were fragmented with the laser and removed with the basket.  Multiple other stones were removed.  Approximately 15 stones and stone fragments were in the cup at the end of the case.  Complications: None  Blood loss: Minimal  Specimens: Stone fragments to patient and then to office lab  Drains: Bilateral 6 x 26 cm Polaris stents  Description of procedure: After consent was obtained patient brought to the operating room.  After adequate anesthesia she was placed in lithotomy position prepped and draped in the usual sterile fashion.  A timeout was performed to confirm the patient and procedure.  The right ureteral stent was grasped and removed through the urethral meatus and a sensor wire advance.  The stent was removed.  I used an access sheath which went easily to place 2 wires and went beside a Glidewire.  I then passed the digital scope and with a 0 tip basket and well as the engage we removed multiple small stones, however many of the calcifications on CT are under the mucosa and early stone precursors as one might expect.  However I did remove multiple small stones and found the largest stone in the right lower pole.  It was grasped and dropped in the upper pole because it was too large to remove.  This was broken at 0.8 and 8.  It was a hard stone.  The fragments were removed sequentially.  I saw no other significant fragments and  did not appreciate any other fragments on fluoroscopy.  The collecting system was inspected.  The collecting system the ureter was irritated and friable from the stent.  The renal papilla were similar.  Therefore decided to leave a stent.  The renal pelvis and ureter were inspected on the way out and the access sheath and the scope were backed out together and the ureter inspected and noted to be normal without injury or stone fragment.  I turned my attention to the left side and remove the left ureteral stent and passed the Glidewire.  I then used the access sheath to get the sensor wire back up the left side.  I went beside the Glidewire again on the left side.  The digital scope was passed into the left kidney multiple stones were removed.  There was a cluster in the lower pole and the large stone in the mid to upper pole posterior calyx.  The stone was grasped and made it to the access sheath but would not enter the sheath.  Therefore while it was in the basket I was able to pass the laser through the other channel and rotate the stone and break it into 2 pieces which then were removed sequentially without difficulty.  Several other smaller stones were removed from the left kidney as well.  I did not appreciate any other significant stones or stone fragments.  The renal pelvis and ureter were inspected on the way out and noted to be normal without injury or stone fragment.  I switched back to the cystoscope in  the right Glidewire was backloaded on the cystoscope and a [id number] stent was placed.  The wire was removed with a good coil seen in the collecting system and the loops were in the bladder.  Similarly the left wire was backloaded and a left stent placed.  Good coil seen in the collecting system and the loops in the bladder.  The bladder was drained and the scope removed.  The strings were secured to the patient.  Lidocaine jelly was instilled per urethra.  She was awakened and taken to the recovery room in  stable condition.

## 2018-03-14 NOTE — Anesthesia Postprocedure Evaluation (Signed)
Anesthesia Post Note  Patient: Mindy Davies  Procedure(s) Performed: CYSTOSCOPY/URETEROSCOPY/HOLMIUM LASER/STENT PLACEMENT (Bilateral Ureter)     Patient location during evaluation: PACU Anesthesia Type: General Level of consciousness: awake and alert Pain management: pain level controlled Vital Signs Assessment: post-procedure vital signs reviewed and stable Respiratory status: spontaneous breathing, nonlabored ventilation, respiratory function stable and patient connected to nasal cannula oxygen Cardiovascular status: blood pressure returned to baseline and stable Postop Assessment: no apparent nausea or vomiting Anesthetic complications: no    Last Vitals:  Vitals:   03/14/18 1513 03/14/18 1515  BP: 121/74 121/74  Pulse: 67 76  Resp: (!) 23 (!) 21  Temp: 36.9 C   SpO2: 100% 100%    Last Pain:  Vitals:   03/14/18 1513  TempSrc:   PainSc: 0-No pain                 Johnathan Tortorelli S

## 2018-03-14 NOTE — Anesthesia Preprocedure Evaluation (Addendum)
Anesthesia Evaluation  Patient identified by MRN, date of birth, ID band Patient awake    Reviewed: Allergy & Precautions, NPO status , Patient's Chart, lab work & pertinent test results  Airway Mallampati: III  TM Distance: >3 FB Neck ROM: Full    Dental no notable dental hx. (+) Teeth Intact, Dental Advisory Given   Pulmonary neg pulmonary ROS,    Pulmonary exam normal breath sounds clear to auscultation       Cardiovascular negative cardio ROS Normal cardiovascular exam Rhythm:Regular Rate:Normal     Neuro/Psych negative neurological ROS  negative psych ROS   GI/Hepatic negative GI ROS, Neg liver ROS,   Endo/Other  Morbid obesity  Renal/GU Renal diseaseBilateral renal stones, Left ureteral stone  negative genitourinary   Musculoskeletal negative musculoskeletal ROS (+)   Abdominal (+) + obese,   Peds  Hematology negative hematology ROS (+)   Anesthesia Other Findings   Reproductive/Obstetrics                            Anesthesia Physical  Anesthesia Plan  ASA: III  Anesthesia Plan: General   Post-op Pain Management:    Induction: Intravenous  PONV Risk Score and Plan: Midazolam, Dexamethasone, Ondansetron and Treatment may vary due to age or medical condition  Airway Management Planned: LMA  Additional Equipment:   Intra-op Plan:   Post-operative Plan: Extubation in OR  Informed Consent: I have reviewed the patients History and Physical, chart, labs and discussed the procedure including the risks, benefits and alternatives for the proposed anesthesia with the patient or authorized representative who has indicated his/her understanding and acceptance.   Dental advisory given  Plan Discussed with: Anesthesiologist, CRNA and Surgeon  Anesthesia Plan Comments:         Anesthesia Quick Evaluation

## 2018-03-14 NOTE — Discharge Instructions (Signed)
Ureteral Stent Implantation, Care After Refer to this sheet in the next few weeks. These instructions provide you with information about caring for yourself after your procedure. Your health care provider may also give you more specific instructions. Your treatment has been planned according to current medical practices, but problems sometimes occur. Call your health care provider if you have any problems or questions after your procedure.  Removal of the stents: Remove the stents by pulling the strings on Friday morning, March 17, 2018  What can I expect after the procedure? After the procedure, it is common to have:  Nausea.  Mild pain when you urinate. You may feel this pain in your lower back or lower abdomen. Pain should stop within a few minutes after you urinate. This may last for up to 1 week.  A small amount of blood in your urine for several days.  Follow these instructions at home:  Medicines  Take over-the-counter and prescription medicines only as told by your health care provider.  If you were prescribed an antibiotic medicine, take it as told by your health care provider. Do not stop taking the antibiotic even if you start to feel better.  Do not drive for 24 hours if you received a sedative.  Do not drive or operate heavy machinery while taking prescription pain medicines. Activity  Return to your normal activities as told by your health care provider. Ask your health care provider what activities are safe for you.  Do not lift anything that is heavier than 10 lb (4.5 kg). Follow this limit for 1 week after your procedure, or for as long as told by your health care provider. General instructions  Watch for any blood in your urine. Call your health care provider if the amount of blood in your urine increases.  If you have a catheter: ? Follow instructions from your health care provider about taking care of your catheter and collection bag. ? Do not take baths, swim,  or use a hot tub until your health care provider approves.  Drink enough fluid to keep your urine clear or pale yellow. 8-10 glasses/day  Keep all follow-up visits as told by your health care provider. This is important. Contact a health care provider if:  You have pain that gets worse or does not get better with medicine, especially pain when you urinate.  You have difficulty urinating.  You feel nauseous or you vomit repeatedly during a period of more than 2 days after the procedure. Get help right away if:  Your urine is dark red or has blood clots in it.  You are leaking urine (have incontinence).  The end of the stent comes out of your urethra.  You cannot urinate.  You have sudden, sharp, or severe pain in your abdomen or lower back.  You have a fever. This information is not intended to replace advice given to you by your health care provider. Make sure you discuss any questions you have with your health care provider. Document Released: 07/18/2013 Document Revised: 04/22/2016 Document Reviewed: 05/30/2015 Elsevier Interactive Patient Education  2018 [employer].    No advil ,aleve, ibuprofen, motrin until 8:30 pm tonight   Post Anesthesia Home Care Instructions  Activity: Get plenty of rest for the remainder of the day. A responsible individual must stay with you for 24 hours following the procedure.  For the next 24 hours, DO NOT: -Drive a car -Advertising copywriter -Drink alcoholic beverages -Take any medication unless instructed by your  physician -Make any legal decisions or sign important papers.  Meals: Start with liquid foods such as gelatin or soup. Progress to regular foods as tolerated. Avoid greasy, spicy, heavy foods. If nausea and/or vomiting occur, drink only clear liquids until the nausea and/or vomiting subsides. Call your physician if vomiting continues.  Special Instructions/Symptoms: Your throat may feel dry or sore from the anesthesia or the  breathing tube placed in your throat during surgery. If this causes discomfort, gargle with warm salt water. The discomfort should disappear within 24 hours.  If you had a scopolamine patch placed behind your ear for the management of post- operative nausea and/or vomiting:  1. The medication in the patch is effective for 72 hours, after which it should be removed.  Wrap patch in a tissue and discard in the trash. Wash hands thoroughly with soap and water. 2. You may remove the patch earlier than 72 hours if you experience unpleasant side effects which may include dry mouth, dizziness or visual disturbances. 3. Avoid touching the patch. Wash your hands with soap and water after contact with the patch.

## 2018-03-14 NOTE — Transfer of Care (Signed)
  Last Vitals:  Vitals Value Taken Time  BP    Temp    Pulse 70 03/14/2018  3:13 PM  Resp    SpO2 100 % 03/14/2018  3:13 PM  Vitals shown include unvalidated device data.  Last Pain:  Vitals:   03/14/18 1136  TempSrc:   PainSc: 0-No pain        Immediate Anesthesia Transfer of Care Note  Patient: Mindy Davies  Procedure(s) Performed: Procedure(s) (LRB): CYSTOSCOPY/URETEROSCOPY/HOLMIUM LASER/STENT PLACEMENT (Bilateral)  Patient Location: PACU  Anesthesia Type: General  Level of Consciousness: awake, alert  and oriented  Airway & Oxygen Therapy: Patient Spontanous Breathing and Patient connected to nasal cannula oxygen  Post-op Assessment: Report given to PACU RN and Post -op Vital signs reviewed and stable  Post vital signs: Reviewed and stable  Complications: No apparent anesthesia complications

## 2018-03-15 ENCOUNTER — Encounter (HOSPITAL_BASED_OUTPATIENT_CLINIC_OR_DEPARTMENT_OTHER): Payer: Self-pay | Admitting: Urology

## 2018-11-29 DIAGNOSIS — U071 COVID-19: Secondary | ICD-10-CM

## 2018-11-29 HISTORY — DX: COVID-19: U07.1

## 2021-02-06 ENCOUNTER — Emergency Department (HOSPITAL_COMMUNITY)
Admission: EM | Admit: 2021-02-06 | Discharge: 2021-02-06 | Disposition: A | Discharge status: Home / Self Care | Payer: 59 | Attending: Emergency Medicine | Admitting: Emergency Medicine

## 2021-02-06 ENCOUNTER — Other Ambulatory Visit: Payer: Self-pay

## 2021-02-06 DIAGNOSIS — Z87442 Personal history of urinary calculi: Secondary | ICD-10-CM | POA: Diagnosis not present

## 2021-02-06 DIAGNOSIS — R112 Nausea with vomiting, unspecified: Secondary | ICD-10-CM | POA: Insufficient documentation

## 2021-02-06 DIAGNOSIS — R109 Unspecified abdominal pain: Secondary | ICD-10-CM | POA: Insufficient documentation

## 2021-02-06 LAB — CBC WITH DIFFERENTIAL/PLATELET
Abs Immature Granulocytes: 0.02 10*3/uL (ref 0.00–0.07)
Basophils Absolute: 0 10*3/uL (ref 0.0–0.1)
Basophils Relative: 0 %
Eosinophils Absolute: 0.2 10*3/uL (ref 0.0–0.5)
Eosinophils Relative: 2 %
HCT: 43.7 % (ref 36.0–46.0)
Hemoglobin: 14.1 g/dL (ref 12.0–15.0)
Immature Granulocytes: 0 %
Lymphocytes Relative: 20 %
Lymphs Abs: 1.6 10*3/uL (ref 0.7–4.0)
MCH: 30.5 pg (ref 26.0–34.0)
MCHC: 32.3 g/dL (ref 30.0–36.0)
MCV: 94.4 fL (ref 80.0–100.0)
Monocytes Absolute: 0.5 10*3/uL (ref 0.1–1.0)
Monocytes Relative: 6 %
Neutro Abs: 5.6 10*3/uL (ref 1.7–7.7)
Neutrophils Relative %: 72 %
Platelets: 302 10*3/uL (ref 150–400)
RBC: 4.63 MIL/uL (ref 3.87–5.11)
RDW: 12.2 % (ref 11.5–15.5)
WBC: 7.8 10*3/uL (ref 4.0–10.5)
nRBC: 0 % (ref 0.0–0.2)

## 2021-02-06 LAB — URINALYSIS, ROUTINE W REFLEX MICROSCOPIC
Bilirubin Urine: NEGATIVE
Glucose, UA: NEGATIVE mg/dL
Ketones, ur: NEGATIVE mg/dL
Nitrite: POSITIVE — AB
Protein, ur: NEGATIVE mg/dL
RBC / HPF: >50 RBC/hpf — ABNORMAL HIGH (ref 0–5)
Specific Gravity, Urine: 1.016 (ref 1.005–1.030)
pH: 9 — ABNORMAL HIGH (ref 5.0–8.0)

## 2021-02-06 LAB — BASIC METABOLIC PANEL
Anion gap: 11 (ref 5–15)
BUN: 10 mg/dL (ref 6–20)
CO2: 23 mmol/L (ref 22–32)
Calcium: 8.3 mg/dL — ABNORMAL LOW (ref 8.9–10.3)
Chloride: 105 mmol/L (ref 98–111)
Creatinine, Ser: 0.78 mg/dL (ref 0.44–1.00)
GFR, Estimated: >60 mL/min (ref >60)
Glucose, Bld: 99 mg/dL (ref 70–99)
Potassium: 3.8 mmol/L (ref 3.5–5.1)
Sodium: 139 mmol/L (ref 135–145)

## 2021-02-06 LAB — PREGNANCY, URINE: Preg Test, Ur: NEGATIVE

## 2021-02-06 MED ORDER — OXYCODONE-ACETAMINOPHEN 5-325 MG PO TABS: 1.0000 | ORAL_TABLET | Freq: Four times a day (QID) | ORAL | 0 refills | Status: DC | PRN

## 2021-02-06 MED ORDER — OXYCODONE-ACETAMINOPHEN 5-325 MG PO TABS
1.0000 | ORAL_TABLET | Freq: Once | ORAL | Status: AC
  Administered 2021-02-06: 1 via ORAL
  Filled 2021-02-06: qty 1

## 2021-02-06 MED ORDER — ONDANSETRON 4 MG PO TBDP
4.0000 mg | ORAL_TABLET | Freq: Once | ORAL | Status: AC
  Administered 2021-02-06: 4 mg via ORAL
  Filled 2021-02-06: qty 1

## 2021-02-06 MED ORDER — ONDANSETRON HCL 4 MG/2ML IJ SOLN
4.0000 mg | Freq: Once | INTRAMUSCULAR | Status: AC
  Administered 2021-02-06: 4 mg via INTRAVENOUS
  Filled 2021-02-06: qty 2

## 2021-02-06 MED ORDER — TAMSULOSIN HCL 0.4 MG PO CAPS: 0.4000 mg | ORAL_CAPSULE | Freq: Every day | ORAL | 0 refills | Status: DC

## 2021-02-06 MED ORDER — SODIUM CHLORIDE 0.9 % IV BOLUS
1000.0000 mL | Freq: Once | INTRAVENOUS | Status: AC
  Administered 2021-02-06: 1000 mL via INTRAVENOUS

## 2021-02-06 MED ORDER — KETOROLAC TROMETHAMINE 30 MG/ML IJ SOLN
30.0000 mg | Freq: Once | INTRAMUSCULAR | Status: AC
  Administered 2021-02-06: 30 mg via INTRAVENOUS
  Filled 2021-02-06: qty 1

## 2021-02-06 MED ORDER — HYDROMORPHONE HCL 1 MG/ML IJ SOLN
1.0000 mg | Freq: Once | INTRAMUSCULAR | Status: AC
  Administered 2021-02-06: 1 mg via INTRAVENOUS
  Filled 2021-02-06: qty 1

## 2021-02-06 NOTE — ED Provider Notes (Addendum)
7:00 AM Signout from [place] PA-C at shift change.  Patient presents today with flank pain and vomiting.  She has a history of kidney stones.  No fevers.  She has required stenting in the past with urologic intervention.  There was a delay in obtaining urine as the patient could not use the restroom.  She was given IV fluids and oral fluids.  At no point did she feel like she was retaining.  UA with positive nitrites, greater than 50 red cells, and 20-50 white cells.  Discussed UA findings with Dr. Lynelle Doctor.  Confirmed with patient that she has not had any fevers.  White blood cell count is normal.  At this point, low clinical concern for pyelonephritis given abrupt onset of pain consistent with previous kidney stones.  Will send urine culture to evaluate for concurrent infection.   Discussed with patient.  Her pain is controlled with oral Percocet and IV Toradol.  Will defer imaging at this point given history.  Will treat as kidney stone with pain medications, Flomax.  Encouraged urology follow-up.  We discussed importance of return to the emergency department with fevers, uncontrolled symptoms such as pain and vomiting.  Patient counseled on use of narcotic pain medications. Counseled not to combine these medications with others containing tylenol. Urged not to drink alcohol, drive, or perform any other activities that requires focus while taking these medications. The patient verbalizes understanding and agrees with the plan.  BP 127/75   Pulse (!) 50   Temp 98 F (36.7 C)   Resp 18   SpO2 99%      Renne Crigler, PA-C 02/06/21 1327    Linwood Dibbles, MD 02/07/21 [phone]

## 2021-02-06 NOTE — ED Notes (Signed)
Pt. Aware urine sample needed, but unable to provide one at this time. purwick on pt.

## 2021-02-06 NOTE — Discharge Instructions (Signed)
Please read and follow all provided instructions.  Your diagnoses today include:  1. Left flank pain     Tests performed today include:  Urine test that showed blood in your urine and some infection fighting cells  Urine culture - to confirm or refute infection, if positive, you will receive a phone call with instructions to pick up antibiotics  Blood test that showed normal kidney function and normal white blood cell counts  Vital signs. See below for your results today.   Medications prescribed:   Percocet (oxycodone/acetaminophen) - narcotic pain medication  DO NOT drive or perform any activities that require you to be awake and alert because this medicine can make you drowsy. BE VERY CAREFUL not to take multiple medicines containing Tylenol (also called acetaminophen). Doing so can lead to an overdose which can damage your liver and cause liver failure and possibly death.   Zofran (ondansetron) - for nausea and vomiting   Flomax (tamsulosin) - relaxes smooth muscle to help kidney stones pass  Take any prescribed medications only as directed.  Home care instructions:  Follow any educational materials contained in this packet.  Please double your fluid intake for the next several days. Strain your urine and save any stones that may pass.   BE VERY CAREFUL not to take multiple medicines containing Tylenol (also called acetaminophen). Doing so can lead to an overdose which can damage your liver and cause liver failure and possibly death.   Follow-up instructions: Please follow-up with your urologist or the urologist referral (provided on front page) in the next 1 week for further evaluation of your symptoms.  Return instructions:  If you need to return to the Emergency Department, go to [facility] and not [facility]. The urologists are located at [facility] and can better care for you at this location.   Please return to the Emergency Department if you  experience worsening symptoms.  Please return if you develop fever or uncontrolled pain or vomiting.  Please return if you have any other emergent concerns.  Additional Information:  Your vital signs today were: BP 127/75   Pulse (!) 50   Temp 98 F (36.7 C)   Resp 18   SpO2 99%  If your blood pressure (BP) was elevated above 135/85 this visit, please have this repeated by your doctor within one month. --------------

## 2021-02-06 NOTE — ED Triage Notes (Signed)
Patient BIB EMS from home c/o Left flank Pain. Pt stated pain started this morning at 5 am. PT reports Emesis x1. Fentanyl IV given by EMS.  Pt hx of Kidney stones.   BP 140/100 PR 70 RR 20 o2 99%

## 2021-02-06 NOTE — ED Notes (Signed)
Pt. Given cup of water.

## 2021-02-06 NOTE — ED Provider Notes (Signed)
[facility]-EMERGENCY DEPT Provider Note   CSN: [id number] Arrival date & time: 02/06/21  0533     History Chief Complaint  Patient presents with  . Flank Pain    Mindy Davies is a 35 y.o. female.  Patient with a history of nephrolithiasis, PCOS presents with sudden onset severe right flank pain at around 4:00 am this morning. She had associated nausea with one episode of vomiting. She is not aware of any hematuria. No fever, diarrhea.   The history is provided by the patient. No language interpreter was used.  Flank Pain       Past Medical History:  Diagnosis Date  . History of kidney stones   . PCOS (polycystic ovarian syndrome)   . Renal calculi    bilateral  . Urgency of urination   . Wears contact lenses     There are no problems to display for this patient.   Past Surgical History:  Procedure Laterality Date  . BUNIONECTOMY Bilateral age 58 and 57  . CYSTOSCOPY WITH RETROGRADE PYELOGRAM, URETEROSCOPY AND STENT PLACEMENT Bilateral 03/03/2018   Procedure: CYSTOSCOPY WITH BILATERAL RETROGRADE PYELOGRAM, LEFT  URETEROSCOPY AND BILATERAL STENT PLACEMENT WITH HOLMIUM LASER;  Surgeon: Jerilee Field, MD;  Location: [facility];  Service: Urology;  Laterality: Bilateral;  . CYSTOSCOPY/URETEROSCOPY/HOLMIUM LASER/STENT PLACEMENT Bilateral 03/14/2018   Procedure: CYSTOSCOPY/URETEROSCOPY/HOLMIUM LASER/STENT PLACEMENT;  Surgeon: Jerilee Field, MD;  Location: [facility];  Service: Urology;  Laterality: Bilateral;  ONLY NEEDS 60 MIN  . EXTRACORPOREAL SHOCK WAVE LITHOTRIPSY  2012 approx.  . WISDOM TOOTH EXTRACTION  2012     OB History    Gravida  1   Para  1   Term  1   Preterm  0   AB  0   Living  1     SAB  0   IAB  0   Ectopic  0   Multiple  0   Live Births  1           Family History  Problem Relation Age of Onset  . Cancer Paternal Grandmother     Social History   Tobacco Use  . Smoking status:  Never Smoker  . Smokeless tobacco: Never Used  Vaping Use  . Vaping Use: Never used  Substance Use Topics  . Alcohol use: Not Currently  . Drug use: No    Home Medications Prior to Admission medications   Medication Sig Start Date End Date Taking? Authorizing Provider  acetaminophen (TYLENOL) 500 MG tablet Take 500 mg by mouth every 6 (six) hours as needed for moderate pain.    [provider]  HYDROmorphone (DILAUDID) 2 MG tablet Take 1 tablet (2 mg total) by mouth every 4 (four) hours as needed for severe pain. 03/03/18   Molpus, John, MD  ondansetron (ZOFRAN ODT) 8 MG disintegrating tablet Take 1 tablet (8 mg total) by mouth every 8 (eight) hours as needed for nausea or vomiting. 03/03/18   Molpus, Jonny Ruiz, MD    Allergies    Patient has no known allergies.  Review of Systems   Review of Systems  Constitutional: Negative for chills and fever.  HENT: Negative.   Respiratory: Negative.   Cardiovascular: Negative.   Gastrointestinal: Positive for nausea and vomiting.  Genitourinary: Positive for flank pain.  Skin: Negative.   Neurological: Negative.     Physical Exam Updated Vital Signs BP (!) 137/91   Pulse 99   Temp 98 F (36.7 C)  Resp (!) 21   SpO2 96%   Physical Exam Vitals and nursing note reviewed.  Constitutional:      Appearance: Normal appearance. She is well-developed. She is obese.  Cardiovascular:     Rate and Rhythm: Normal rate.  Pulmonary:     Effort: Pulmonary effort is normal.  Abdominal:     General: There is no distension.     Palpations: Abdomen is soft.     Tenderness: There is no abdominal tenderness. There is right CVA tenderness.  Musculoskeletal:        General: Normal range of motion.     Cervical back: Normal range of motion.  Skin:    General: Skin is warm and dry.  Neurological:     Mental Status: She is alert and oriented to person, place, and time.     ED Results / Procedures / Treatments   Labs (all labs ordered are  listed, but only abnormal results are displayed) Labs Reviewed  URINALYSIS, ROUTINE W REFLEX MICROSCOPIC  PREGNANCY, URINE  CBC WITH DIFFERENTIAL/PLATELET  BASIC METABOLIC PANEL    EKG None  Radiology No results found.  Procedures Procedures   Medications Ordered in ED Medications  HYDROmorphone (DILAUDID) injection 1 mg (1 mg Intravenous Given 02/06/21 0616)  ondansetron (ZOFRAN) injection 4 mg (4 mg Intravenous Given 02/06/21 7253)    ED Course  I have reviewed the triage vital signs and the nursing notes.  Pertinent labs & imaging results that were available during my care of the patient were reviewed by me and considered in my medical decision making (see chart for details).    MDM Rules/Calculators/A&P                          Patient to ED with sudden onset right flank pain. History of kidney stones. No fever.   IV started and pain medications provided. On recheck, she is more comfortable. No further nausea.   Labs pending. CT to be considered after review of lab results. Suspect recurrent kidney stones based on presentation and history.   Patient care signed out to [facility], PA-C, for lab review, pain management and appropriate disposition.    Final Clinical Impression(s) / ED Diagnoses Final diagnoses:  None   1. Right flank pain  Rx / DC Orders ED Discharge Orders    None       Elpidio Anis, Cordelia Poche 02/06/21 6644    Glynn Octave, MD 02/06/21 [phone]

## 2021-02-08 ENCOUNTER — Telehealth (HOSPITAL_COMMUNITY): Payer: Self-pay | Admitting: Emergency Medicine

## 2021-02-08 MED ORDER — CEPHALEXIN 500 MG PO CAPS: 500.0000 mg | ORAL_CAPSULE | Freq: Three times a day (TID) | ORAL | 0 refills | Status: DC

## 2021-02-08 NOTE — Telephone Encounter (Signed)
Urine culture with >100,000 colonies of gram negative rods. Patient not given antibiotics on discharge for unclear reasons. Keflex called to patient's preferred pharmacy. Message left with patient to return to the ED to rule out obstructing kidney stone with concurrent infection.

## 2021-02-09 ENCOUNTER — Observation Stay (HOSPITAL_COMMUNITY): Payer: 59

## 2021-02-09 ENCOUNTER — Emergency Department (HOSPITAL_BASED_OUTPATIENT_CLINIC_OR_DEPARTMENT_OTHER): Payer: 59

## 2021-02-09 ENCOUNTER — Encounter (HOSPITAL_COMMUNITY)
Admission: EM | Disposition: A | Discharge status: Home / Self Care | Payer: Self-pay | Source: Home / Self Care | Attending: Emergency Medicine

## 2021-02-09 ENCOUNTER — Observation Stay (HOSPITAL_COMMUNITY): Payer: 59 | Admitting: Certified Registered Nurse Anesthetist

## 2021-02-09 ENCOUNTER — Other Ambulatory Visit: Payer: Self-pay

## 2021-02-09 ENCOUNTER — Observation Stay (HOSPITAL_BASED_OUTPATIENT_CLINIC_OR_DEPARTMENT_OTHER)
Admission: EM | Admit: 2021-02-09 | Discharge: 2021-02-10 | Disposition: A | Discharge status: Home / Self Care | Payer: 59 | Attending: Internal Medicine | Admitting: Internal Medicine

## 2021-02-09 ENCOUNTER — Encounter (HOSPITAL_BASED_OUTPATIENT_CLINIC_OR_DEPARTMENT_OTHER): Payer: Self-pay

## 2021-02-09 DIAGNOSIS — N3080 Other cystitis without hematuria: Secondary | ICD-10-CM | POA: Diagnosis not present

## 2021-02-09 DIAGNOSIS — N10 Acute tubulo-interstitial nephritis: Secondary | ICD-10-CM | POA: Insufficient documentation

## 2021-02-09 DIAGNOSIS — Z79899 Other long term (current) drug therapy: Secondary | ICD-10-CM | POA: Diagnosis not present

## 2021-02-09 DIAGNOSIS — N390 Urinary tract infection, site not specified: Secondary | ICD-10-CM

## 2021-02-09 DIAGNOSIS — N1330 Unspecified hydronephrosis: Secondary | ICD-10-CM

## 2021-02-09 DIAGNOSIS — R109 Unspecified abdominal pain: Secondary | ICD-10-CM | POA: Diagnosis present

## 2021-02-09 DIAGNOSIS — E876 Hypokalemia: Secondary | ICD-10-CM | POA: Insufficient documentation

## 2021-02-09 DIAGNOSIS — R739 Hyperglycemia, unspecified: Secondary | ICD-10-CM | POA: Diagnosis not present

## 2021-02-09 DIAGNOSIS — B9620 Unspecified Escherichia coli [E. coli] as the cause of diseases classified elsewhere: Secondary | ICD-10-CM | POA: Insufficient documentation

## 2021-02-09 DIAGNOSIS — Z20822 Contact with and (suspected) exposure to covid-19: Secondary | ICD-10-CM | POA: Diagnosis not present

## 2021-02-09 DIAGNOSIS — Z6841 Body Mass Index (BMI) 40.0 and over, adult: Secondary | ICD-10-CM | POA: Insufficient documentation

## 2021-02-09 DIAGNOSIS — N179 Acute kidney failure, unspecified: Secondary | ICD-10-CM | POA: Insufficient documentation

## 2021-02-09 DIAGNOSIS — N132 Hydronephrosis with renal and ureteral calculous obstruction: Principal | ICD-10-CM | POA: Insufficient documentation

## 2021-02-09 HISTORY — PX: CYSTOSCOPY W/ URETERAL STENT PLACEMENT: SHX1429

## 2021-02-09 LAB — RESP PANEL BY RT-PCR (FLU A&B, COVID) ARPGX2
Influenza A by PCR: NEGATIVE
Influenza B by PCR: NEGATIVE
SARS Coronavirus 2 by RT PCR: NEGATIVE

## 2021-02-09 LAB — COMPREHENSIVE METABOLIC PANEL
ALT: 17 U/L (ref 0–44)
AST: 18 U/L (ref 15–41)
Albumin: 3.2 g/dL — ABNORMAL LOW (ref 3.5–5.0)
Alkaline Phosphatase: 141 U/L — ABNORMAL HIGH (ref 38–126)
Anion gap: 12 (ref 5–15)
BUN: 12 mg/dL (ref 6–20)
CO2: 25 mmol/L (ref 22–32)
Calcium: 9 mg/dL (ref 8.9–10.3)
Chloride: 98 mmol/L (ref 98–111)
Creatinine, Ser: 1.11 mg/dL — ABNORMAL HIGH (ref 0.44–1.00)
GFR, Estimated: >60 mL/min (ref >60)
Glucose, Bld: 105 mg/dL — ABNORMAL HIGH (ref 70–99)
Potassium: 3.4 mmol/L — ABNORMAL LOW (ref 3.5–5.1)
Sodium: 135 mmol/L (ref 135–145)
Total Bilirubin: 1 mg/dL (ref 0.3–1.2)
Total Protein: 8 g/dL (ref 6.5–8.1)

## 2021-02-09 LAB — CBC WITH DIFFERENTIAL/PLATELET
Abs Immature Granulocytes: 0.06 10*3/uL (ref 0.00–0.07)
Basophils Absolute: 0 10*3/uL (ref 0.0–0.1)
Basophils Relative: 0 %
Eosinophils Absolute: 0 10*3/uL (ref 0.0–0.5)
Eosinophils Relative: 0 %
HCT: 39.5 % (ref 36.0–46.0)
Hemoglobin: 13.3 g/dL (ref 12.0–15.0)
Immature Granulocytes: 0 %
Lymphocytes Relative: 8 %
Lymphs Abs: 1.2 10*3/uL (ref 0.7–4.0)
MCH: 30.7 pg (ref 26.0–34.0)
MCHC: 33.7 g/dL (ref 30.0–36.0)
MCV: 91.2 fL (ref 80.0–100.0)
Monocytes Absolute: 0.9 10*3/uL (ref 0.1–1.0)
Monocytes Relative: 6 %
Neutro Abs: 12.5 10*3/uL — ABNORMAL HIGH (ref 1.7–7.7)
Neutrophils Relative %: 86 %
Platelets: 227 10*3/uL (ref 150–400)
RBC: 4.33 MIL/uL (ref 3.87–5.11)
RDW: 12 % (ref 11.5–15.5)
WBC: 14.7 10*3/uL — ABNORMAL HIGH (ref 4.0–10.5)
nRBC: 0 % (ref 0.0–0.2)

## 2021-02-09 LAB — URINE CULTURE: Culture: >=100000 — AB

## 2021-02-09 LAB — LACTIC ACID, PLASMA: Lactic Acid, Venous: 1 mmol/L (ref 0.5–1.9)

## 2021-02-09 LAB — HCG, SERUM, QUALITATIVE: Preg, Serum: NEGATIVE

## 2021-02-09 SURGERY — CYSTOSCOPY, WITH RETROGRADE PYELOGRAM AND URETERAL STENT INSERTION
Anesthesia: General | Site: Urethra | Laterality: Left

## 2021-02-09 MED ORDER — FENTANYL CITRATE (PF) 100 MCG/2ML IJ SOLN
INTRAMUSCULAR | Status: AC
  Filled 2021-02-09: qty 2

## 2021-02-09 MED ORDER — DEXAMETHASONE SODIUM PHOSPHATE 10 MG/ML IJ SOLN
INTRAMUSCULAR | Status: AC
  Filled 2021-02-09: qty 1

## 2021-02-09 MED ORDER — IOHEXOL 300 MG/ML  SOLN
100.0000 mL | Freq: Once | INTRAMUSCULAR | Status: AC | PRN
  Administered 2021-02-09: 100 mL via INTRAVENOUS

## 2021-02-09 MED ORDER — ONDANSETRON HCL 4 MG/2ML IJ SOLN
INTRAMUSCULAR | Status: AC
  Filled 2021-02-09: qty 2

## 2021-02-09 MED ORDER — LACTATED RINGERS IV SOLN: INTRAVENOUS | Status: DC | PRN

## 2021-02-09 MED ORDER — HYDRALAZINE HCL 20 MG/ML IJ SOLN
10.0000 mg | Freq: Four times a day (QID) | INTRAMUSCULAR | Status: DC | PRN
Start: 2021-02-09 — End: 2021-02-10

## 2021-02-09 MED ORDER — LACTATED RINGERS IV BOLUS
1000.0000 mL | Freq: Once | INTRAVENOUS | Status: AC
  Administered 2021-02-09: 1000 mL via INTRAVENOUS

## 2021-02-09 MED ORDER — HYDROCODONE-ACETAMINOPHEN 5-325 MG PO TABS: 1.0000 | ORAL_TABLET | ORAL | Status: DC | PRN

## 2021-02-09 MED ORDER — HEPARIN SODIUM (PORCINE) 5000 UNIT/ML IJ SOLN
5000.0000 [IU] | Freq: Three times a day (TID) | INTRAMUSCULAR | Status: DC
  Administered 2021-02-10: 5000 [IU] via SUBCUTANEOUS
  Filled 2021-02-09: qty 1

## 2021-02-09 MED ORDER — ACETAMINOPHEN 650 MG RE SUPP: 650.0000 mg | Freq: Four times a day (QID) | RECTAL | Status: DC | PRN

## 2021-02-09 MED ORDER — ACETAMINOPHEN 325 MG PO TABS: 650.0000 mg | ORAL_TABLET | Freq: Four times a day (QID) | ORAL | Status: DC | PRN

## 2021-02-09 MED ORDER — ONDANSETRON HCL 4 MG PO TABS: 4.0000 mg | ORAL_TABLET | Freq: Four times a day (QID) | ORAL | Status: DC | PRN

## 2021-02-09 MED ORDER — LIDOCAINE HCL (CARDIAC) PF 100 MG/5ML IV SOSY
PREFILLED_SYRINGE | INTRAVENOUS | Status: DC | PRN
  Administered 2021-02-09: 60 mg via INTRAVENOUS

## 2021-02-09 MED ORDER — BISACODYL 10 MG RE SUPP: 10.0000 mg | Freq: Every day | RECTAL | Status: DC | PRN

## 2021-02-09 MED ORDER — OXYCODONE HCL 5 MG PO TABS
5.0000 mg | ORAL_TABLET | Freq: Once | ORAL | Status: DC | PRN
Start: 2021-02-09 — End: 2021-02-10

## 2021-02-09 MED ORDER — DEXAMETHASONE SODIUM PHOSPHATE 10 MG/ML IJ SOLN
INTRAMUSCULAR | Status: DC | PRN
  Administered 2021-02-09: 5 mg via INTRAVENOUS

## 2021-02-09 MED ORDER — PROMETHAZINE HCL 25 MG/ML IJ SOLN: 6.2500 mg | INTRAMUSCULAR | Status: DC | PRN

## 2021-02-09 MED ORDER — OXYCODONE HCL 5 MG/5ML PO SOLN
5.0000 mg | Freq: Once | ORAL | Status: DC | PRN
Start: 2021-02-09 — End: 2021-02-10

## 2021-02-09 MED ORDER — AMISULPRIDE (ANTIEMETIC) 5 MG/2ML IV SOLN: 10.0000 mg | Freq: Once | INTRAVENOUS | Status: DC | PRN

## 2021-02-09 MED ORDER — MIDAZOLAM HCL 2 MG/2ML IJ SOLN
INTRAMUSCULAR | Status: DC | PRN
  Administered 2021-02-09: 2 mg via INTRAVENOUS

## 2021-02-09 MED ORDER — SODIUM CHLORIDE 0.9 % IV SOLN
2.0000 g | INTRAVENOUS | Status: DC
  Filled 2021-02-09 (×2): qty 20

## 2021-02-09 MED ORDER — MORPHINE SULFATE (PF) 4 MG/ML IV SOLN
4.0000 mg | Freq: Once | INTRAVENOUS | Status: AC
  Administered 2021-02-09: 4 mg via INTRAVENOUS
  Filled 2021-02-09: qty 1

## 2021-02-09 MED ORDER — PROPOFOL 10 MG/ML IV BOLUS
INTRAVENOUS | Status: AC
  Filled 2021-02-09: qty 40

## 2021-02-09 MED ORDER — SODIUM CHLORIDE 0.9 % IV SOLN: INTRAVENOUS | Status: DC

## 2021-02-09 MED ORDER — MIDAZOLAM HCL 2 MG/2ML IJ SOLN
INTRAMUSCULAR | Status: AC
  Filled 2021-02-09: qty 2

## 2021-02-09 MED ORDER — ONDANSETRON HCL 4 MG/2ML IJ SOLN
INTRAMUSCULAR | Status: DC | PRN
  Administered 2021-02-09: 4 mg via INTRAVENOUS

## 2021-02-09 MED ORDER — DEXTROSE 5 % IV SOLN
INTRAVENOUS | Status: DC | PRN
  Administered 2021-02-09: 1 g via INTRAVENOUS

## 2021-02-09 MED ORDER — TAMSULOSIN HCL 0.4 MG PO CAPS
0.4000 mg | ORAL_CAPSULE | Freq: Every day | ORAL | Status: DC
  Administered 2021-02-10: 0.4 mg via ORAL
  Filled 2021-02-09: qty 1

## 2021-02-09 MED ORDER — STERILE WATER FOR IRRIGATION IR SOLN
Status: DC | PRN
  Administered 2021-02-09: 3000 mL

## 2021-02-09 MED ORDER — IOHEXOL 300 MG/ML  SOLN
INTRAMUSCULAR | Status: DC | PRN
  Administered 2021-02-09: 50 mL via URETHRAL

## 2021-02-09 MED ORDER — FENTANYL CITRATE (PF) 100 MCG/2ML IJ SOLN
INTRAMUSCULAR | Status: DC | PRN
  Administered 2021-02-09 (×2): 50 ug via INTRAVENOUS
  Administered 2021-02-09: 100 ug via INTRAVENOUS

## 2021-02-09 MED ORDER — ONDANSETRON HCL 4 MG/2ML IJ SOLN
4.0000 mg | Freq: Once | INTRAMUSCULAR | Status: AC
  Administered 2021-02-09: 4 mg via INTRAVENOUS
  Filled 2021-02-09: qty 2

## 2021-02-09 MED ORDER — HYDROMORPHONE HCL 1 MG/ML IJ SOLN: 0.2500 mg | INTRAMUSCULAR | Status: DC | PRN

## 2021-02-09 MED ORDER — FENTANYL CITRATE (PF) 100 MCG/2ML IJ SOLN: 12.5000 ug | INTRAMUSCULAR | Status: DC | PRN

## 2021-02-09 MED ORDER — PROPOFOL 10 MG/ML IV BOLUS
INTRAVENOUS | Status: DC | PRN
  Administered 2021-02-09: 200 mg via INTRAVENOUS

## 2021-02-09 MED ORDER — ONDANSETRON HCL 4 MG/2ML IJ SOLN: 4.0000 mg | Freq: Four times a day (QID) | INTRAMUSCULAR | Status: DC | PRN

## 2021-02-09 MED ORDER — SODIUM CHLORIDE 0.9 % IV SOLN
1.0000 g | Freq: Once | INTRAVENOUS | Status: AC
  Administered 2021-02-09: 1 g via INTRAVENOUS
  Filled 2021-02-09: qty 10

## 2021-02-09 MED ORDER — POLYETHYLENE GLYCOL 3350 17 G PO PACK: 17.0000 g | PACK | Freq: Every day | ORAL | Status: DC | PRN

## 2021-02-09 MED ORDER — LIDOCAINE 2% (20 MG/ML) 5 ML SYRINGE
INTRAMUSCULAR | Status: AC
  Filled 2021-02-09: qty 5

## 2021-02-09 SURGICAL SUPPLY — 19 items
BAG URINE DRAIN 2000ML AR STRL (UROLOGICAL SUPPLIES) ×2 IMPLANT
BAG URO CATCHER STRL LF (MISCELLANEOUS) ×2 IMPLANT
CATH FOLEY 2WAY SLVR  5CC 16FR (CATHETERS) ×1
CATH FOLEY 2WAY SLVR 5CC 16FR (CATHETERS) ×1 IMPLANT
CATH URET 5FR 28IN OPEN ENDED (CATHETERS) IMPLANT
CLOTH BEACON ORANGE TIMEOUT ST (SAFETY) ×2 IMPLANT
GLOVE SURG POLYISO LF SZ8 (GLOVE) IMPLANT
GOWN STRL REUS W/TWL XL LVL3 (GOWN DISPOSABLE) ×2 IMPLANT
GUIDEWIRE ANG ZIPWIRE 035X150 (WIRE) ×2 IMPLANT
GUIDEWIRE STR DUAL SENSOR (WIRE) ×2 IMPLANT
KIT TURNOVER KIT A (KITS) ×2 IMPLANT
MANIFOLD NEPTUNE II (INSTRUMENTS) ×2 IMPLANT
PACK CYSTO (CUSTOM PROCEDURE TRAY) ×2 IMPLANT
STENT URET 6FRX24 CONTOUR (STENTS) ×2 IMPLANT
STENT URET 6FRX26 CONTOUR (STENTS) ×2 IMPLANT
SYR 10ML LL (SYRINGE) ×2 IMPLANT
TUBING CONNECTING 10 (TUBING) ×2 IMPLANT
TUBING UROLOGY SET (TUBING) IMPLANT
WATER STERILE IRR 500ML POUR (IV SOLUTION) ×2 IMPLANT

## 2021-02-09 NOTE — Transfer of Care (Signed)
Immediate Anesthesia Transfer of Care Note  Patient: Mindy Davies  Procedure(s) Performed: CYSTOSCOPY WITH RETROGRADE PYELOGRAM/URETERAL STENT PLACEMENT (Left Urethra)  Patient Location: PACU  Anesthesia Type:General  Level of Consciousness: drowsy and patient cooperative  Airway & Oxygen Therapy: Patient Spontanous Breathing and Patient connected to face mask oxygen  Post-op Assessment: Report given to RN and Post -op Vital signs reviewed and stable  Post vital signs: Reviewed and stable  Last Vitals:  Vitals Value Taken Time  BP 130/74 02/09/21 2217  Temp    Pulse 83 02/09/21 2219  Resp 25 02/09/21 2219  SpO2 100 % 02/09/21 2219  Vitals shown include unvalidated device data.  Last Pain:  Vitals:   02/09/21 2100  TempSrc: Oral  PainSc:          Complications: No complications documented.

## 2021-02-09 NOTE — Progress Notes (Signed)
Patient ID: Mindy Davies, female   DOB: 23-May-1986, 35 y.o.   MRN: [id number]  Renal US shows persistent left hydro.   I will get her added on for cystoscopy and left ureteral stenting tonight.

## 2021-02-09 NOTE — ED Provider Notes (Signed)
[facility] EMERGENCY DEPARTMENT Provider Note   CSN: [id number] Arrival date & time: 02/09/21  0915     History Chief Complaint  Patient presents with  . Abdominal Pain    Mindy Davies is a 35 y.o. female.  HPI      35yo female with history of PCOS, nephrolithiasis with history of stent/lithotripsy, presents with concern for left flank and abdominal pain.  Abdominal pain and flank pain began about 4 days ago abruptly. Severe pain, consistent with prior nephrolithiasis.  Was clinically diagnosed with kidney stones on ED visit 3/11 and given pain medications however pain has been severe and persistent and she has had increasing nausea and vomiting.  No diarrhea, constipation.  Denies dysuria.  No fevers. Has had chills.     Past Medical History:  Diagnosis Date  . History of kidney stones   . PCOS (polycystic ovarian syndrome)   . Renal calculi    bilateral  . Urgency of urination   . Wears contact lenses     Patient Active Problem List   Diagnosis Date Noted  . Acute pyelonephritis 02/09/2021  . E. coli UTI (urinary tract infection) 02/09/2021  . AKI (acute kidney injury) (HCC) 02/09/2021  . Hypokalemia 02/09/2021    Past Surgical History:  Procedure Laterality Date  . BUNIONECTOMY Bilateral age 61 and 40  . CYSTOSCOPY WITH RETROGRADE PYELOGRAM, URETEROSCOPY AND STENT PLACEMENT Bilateral 03/03/2018   Procedure: CYSTOSCOPY WITH BILATERAL RETROGRADE PYELOGRAM, LEFT  URETEROSCOPY AND BILATERAL STENT PLACEMENT WITH HOLMIUM LASER;  Surgeon: Jerilee Field, MD;  Location: [facility];  Service: Urology;  Laterality: Bilateral;  . CYSTOSCOPY/URETEROSCOPY/HOLMIUM LASER/STENT PLACEMENT Bilateral 03/14/2018   Procedure: CYSTOSCOPY/URETEROSCOPY/HOLMIUM LASER/STENT PLACEMENT;  Surgeon: Jerilee Field, MD;  Location: [facility];  Service: Urology;  Laterality: Bilateral;  ONLY NEEDS 60 MIN  . EXTRACORPOREAL SHOCK WAVE LITHOTRIPSY  2012 approx.  .  WISDOM TOOTH EXTRACTION  2012     OB History    Gravida  1   Para  1   Term  1   Preterm  0   AB  0   Living  1     SAB  0   IAB  0   Ectopic  0   Multiple  0   Live Births  1           Family History  Problem Relation Age of Onset  . Cancer Paternal Grandmother     Social History   Tobacco Use  . Smoking status: Never Smoker  . Smokeless tobacco: Never Used  Vaping Use  . Vaping Use: Never used  Substance Use Topics  . Alcohol use: Not Currently  . Drug use: No    Home Medications Prior to Admission medications   Medication Sig Start Date End Date Taking? Authorizing Provider  acetaminophen (TYLENOL) 500 MG tablet Take 500 mg by mouth every 6 (six) hours as needed for moderate pain.    [provider]  cephALEXin (KEFLEX) 500 MG capsule Take 1 capsule (500 mg total) by mouth 3 (three) times daily. 02/08/21   Rancour, Jeannett Senior, MD  oxyCODONE-acetaminophen (PERCOCET/ROXICET) 5-325 MG tablet Take 1 tablet by mouth every 6 (six) hours as needed for severe pain. 02/06/21   Renne Crigler, PA-C  tamsulosin (FLOMAX) 0.4 MG CAPS capsule Take 1 capsule (0.4 mg total) by mouth daily. 02/06/21   Renne Crigler, PA-C    Allergies    Patient has no known allergies.  Review of Systems   Review  of Systems  Constitutional: Positive for chills. Negative for fever.  HENT: Negative for sore throat.   Eyes: Negative for visual disturbance.  Respiratory: Negative for cough and shortness of breath.   Cardiovascular: Negative for chest pain.  Gastrointestinal: Positive for abdominal pain, nausea and vomiting. Negative for constipation and diarrhea.  Genitourinary: Positive for flank pain. Negative for difficulty urinating.  Musculoskeletal: Positive for back pain. Negative for neck pain.  Skin: Negative for rash.  Neurological: Positive for headaches. Negative for syncope.    Physical Exam Updated Vital Signs BP 115/75 (BP Location: Right Arm)   Pulse (!)  54   Temp 97.7 F (36.5 C) (Oral)   Resp 20   Ht 5\' 7"  (1.702 m)   Wt 122.1 kg   LMP 01/12/2021 Comment: Neg HCG  SpO2 98%   BMI 42.16 kg/m   Physical Exam Vitals and nursing note reviewed.  Constitutional:      General: She is not in acute distress.    Appearance: She is well-developed. She is ill-appearing. She is not diaphoretic.  HENT:     Head: Normocephalic and atraumatic.  Eyes:     Conjunctiva/sclera: Conjunctivae normal.  Cardiovascular:     Rate and Rhythm: Normal rate and regular rhythm.  Pulmonary:     Effort: Pulmonary effort is normal. No respiratory distress.  Abdominal:     General: There is no distension.     Palpations: Abdomen is soft.     Tenderness: There is abdominal tenderness in the left upper quadrant. There is left CVA tenderness and guarding.  Musculoskeletal:        General: No tenderness.     Cervical back: Normal range of motion.  Skin:    General: Skin is warm and dry.     Findings: No erythema or rash.  Neurological:     Mental Status: She is alert and oriented to person, place, and time.     ED Results / Procedures / Treatments   Labs (all labs ordered are listed, but only abnormal results are displayed) Labs Reviewed  CBC WITH DIFFERENTIAL/PLATELET - Abnormal; Notable for the following components:      Result Value   WBC 14.7 (*)    Neutro Abs 12.5 (*)    All other components within normal limits  COMPREHENSIVE METABOLIC PANEL - Abnormal; Notable for the following components:   Potassium 3.4 (*)    Glucose, Bld 105 (*)    Creatinine, Ser 1.11 (*)    Albumin 3.2 (*)    Alkaline Phosphatase 141 (*)    All other components within normal limits  COMPREHENSIVE METABOLIC PANEL - Abnormal; Notable for the following components:   Glucose, Bld 119 (*)    Calcium 8.7 (*)    Albumin 3.1 (*)    Alkaline Phosphatase 136 (*)    All other components within normal limits  CBC WITH DIFFERENTIAL/PLATELET - Abnormal; Notable for the  following components:   WBC 11.3 (*)    Neutro Abs 10.0 (*)    All other components within normal limits  RESP PANEL BY RT-PCR (FLU A&B, COVID) ARPGX2  CULTURE, BLOOD (ROUTINE X 2)  CULTURE, BLOOD (ROUTINE X 2)  URINE CULTURE  LACTIC ACID, PLASMA  HCG, SERUM, QUALITATIVE  TSH  MAGNESIUM  PHOSPHORUS  HIV ANTIBODY (ROUTINE TESTING W REFLEX)  HEMOGLOBIN A1C    EKG None  Radiology CT ABDOMEN PELVIS W CONTRAST  Result Date: 02/09/2021 CLINICAL DATA:  LEFT flank and LEFT lower quadrant pain, fever, tenderness,  question kidney stone, perinephric abscess EXAM: CT ABDOMEN AND PELVIS WITH CONTRAST TECHNIQUE: Multidetector CT imaging of the abdomen and pelvis was performed using the standard protocol following bolus administration of intravenous contrast. Sagittal and coronal MPR images reconstructed from axial data set. CONTRAST:  OMNIPAQUE IOHEXOL 300 MG/ML SOLN IV. No oral contrast. COMPARISON:  03/03/2018 FINDINGS: Lower chest: Minimal atelectasis LEFT lung base. Hepatobiliary: Gallbladder and liver normal appearance Pancreas: Normal appearance Spleen: Normal appearance Adrenals/Urinary Tract: Adrenal glands normal appearance. Multiple small BILATERAL nonobstructing renal calculi. Mild enlargement of LEFT kidney with slight delay in LEFT nephrogram. Mild LEFT hydronephrosis and hydroureter extending to urinary bladder. Diffuse wall thickening of the ureteral wall and renal pelvis. No obstructing renal calculus is seen within the LEFT ureter. Significant peripelvic and mild perinephric edema of LEFT kidney. Bladder unremarkable. Stomach/Bowel: Normal appendix. Stomach and bowel loops normal appearance. Vascular/Lymphatic: Aorta normal caliber. Circumaortic LEFT renal vein. Upper normal sized LEFT para-aortic lymph node 10 mm short axis image 47. Enlarged portal caval node 17 mm image 34. Additional scattered normal sized LEFT para-aortic, aortocaval, and retroperitoneal nodes. Reproductive:  Unremarkable uterus and ovaries for age. Other: No free air or free fluid.  No hernia. Musculoskeletal: Unremarkable IMPRESSION: Multiple small BILATERAL nonobstructing renal calculi. Diffuse wall thickening and enhancement of the LEFT ureteral wall and renal pelvis suspicious for urinary tract infection; recommend correlation with urinalysis. LEFT hydronephrosis and hydroureter without visualized obstructing calculus, could represent passed calculus or be related to infection. Periportal and perinephric edema potentially due to obstruction or infection but forniceal rupture not excluded. Enlarged portal caval lymph node 17 mm short axis, nonspecific. Electronically Signed   By: Ulyses Southward M.D.   On: 02/09/2021 13:58   US RENAL  Result Date: 02/09/2021 CLINICAL DATA:  Acute kidney injury EXAM: RENAL / URINARY TRACT ULTRASOUND COMPLETE COMPARISON:  04/26/2018, CT 02/09/2021 FINDINGS: Right Kidney: Renal measurements: 13.7 x 5.1 x 5.5 cm = volume: 199.9 mL. Cortical echogenicity is within normal limits. No mass or hydronephrosis. Shadowing stone at the lower pole measuring 8 mm. Left Kidney: Renal measurements: 13.9 x 7.3 x 8.5 cm = volume: 451.6 mL. Echogenicity within normal limits. Mild to moderate left hydronephrosis and proximal hydroureter. 4 mm shadowing stone within the upper pole. Bladder: Foley catheter within the bladder Other: None. IMPRESSION: 1. Similar mild to moderate left hydronephrosis and proximal hydroureter. No right hydronephrosis. 2. Bilateral kidney stones Electronically Signed   By: Jasmine Pang M.D.   On: 02/09/2021 19:31   DG C-Arm 1-60 Min-No Report  Result Date: 02/09/2021 Fluoroscopy was utilized by the requesting physician.  No radiographic interpretation.    Procedures .Critical Care Performed by: Alvira Monday, MD Authorized by: Alvira Monday, MD   Critical care provider statement:    Critical care time (minutes):  30   Critical care was time spent personally  by me on the following activities:  Discussions with consultants, evaluation of patient's response to treatment, examination of patient, ordering and performing treatments and interventions, ordering and review of laboratory studies, ordering and review of radiographic studies, pulse oximetry, re-evaluation of patient's condition, obtaining history from patient or surrogate and review of old charts     Medications Ordered in ED Medications  tamsulosin (FLOMAX) capsule 0.4 mg ( Oral MAR Unhold 02/10/21 0002)  heparin injection 5,000 Units (5,000 Units Subcutaneous Given 02/10/21 0507)  0.9 %  sodium chloride infusion ( Intravenous New Bag/Given 02/10/21 0511)  acetaminophen (TYLENOL) tablet 650 mg ( Oral MAR Unhold 02/10/21  0002)    Or  acetaminophen (TYLENOL) suppository 650 mg ( Rectal MAR Unhold 02/10/21 0002)  HYDROcodone-acetaminophen (NORCO/VICODIN) 5-325 MG per tablet 1-2 tablet ( Oral MAR Unhold 02/10/21 0002)  fentaNYL (SUBLIMAZE) injection 12.5 mcg ( Intravenous MAR Unhold 02/10/21 0002)  polyethylene glycol (MIRALAX / GLYCOLAX) packet 17 g ( Oral MAR Unhold 02/10/21 0002)  bisacodyl (DULCOLAX) suppository 10 mg ( Rectal MAR Unhold 02/10/21 0002)  ondansetron (ZOFRAN) tablet 4 mg ( Oral MAR Unhold 02/10/21 0002)    Or  ondansetron (ZOFRAN) injection 4 mg ( Intravenous MAR Unhold 02/10/21 0002)  hydrALAZINE (APRESOLINE) injection 10 mg ( Intravenous MAR Unhold 02/10/21 0002)  cefTRIAXone (ROCEPHIN) 2 g in sodium chloride 0.9 % 100 mL IVPB ( Intravenous MAR Unhold 02/10/21 0002)  Chlorhexidine Gluconate Cloth 2 % PADS 6 each (has no administration in time range)  lactated ringers bolus 1,000 mL ( Intravenous Stopped 02/09/21 1152)  cefTRIAXone (ROCEPHIN) 1 g in sodium chloride 0.9 % 100 mL IVPB ( Intravenous Stopped 02/09/21 1044)  ondansetron (ZOFRAN) injection 4 mg (4 mg Intravenous Given 02/09/21 1002)  morphine 4 MG/ML injection 4 mg (4 mg Intravenous Given 02/09/21 1002)  iohexol (OMNIPAQUE) 300  MG/ML solution 100 mL (100 mLs Intravenous Contrast Given 02/09/21 1323)  morphine 4 MG/ML injection 4 mg (4 mg Intravenous Given 02/09/21 1514)    ED Course  I have reviewed the triage vital signs and the nursing notes.  Pertinent labs & imaging results that were available during my care of the patient were reviewed by me and considered in my medical decision making (see chart for details).    MDM Rules/Calculators/A&P                          34yo female with history of PCOS, nephrolithiasis with history of stent/lithotripsy, presents with concern for left flank and abdominal pain.  Had been seen in ED 3/11 and had urine cx that was positive for E Coli but has not started abx.  Labs show leukocytosis, increase in Cr. Lactic acid WNL. Given IV fluids and rocephin on arrival and CT ordered to evaluate for perinephric abscess/stones or other abnormalities shows hydronephrosis, possible forniceal rupture without signs of stones.  Consulted Dr. Annabell HowellsWrenn of Urology.  Will admit to hospitalist and consider procedure.      Final Clinical Impression(s) / ED Diagnoses Final diagnoses:  Hydronephrosis  AKI (acute kidney injury) [facility](HCC)    Rx / DC Orders ED Discharge Orders    None       Alvira MondaySchlossman, Aaryn Sermon, MD 02/10/21 [phone]

## 2021-02-09 NOTE — H&P (Signed)
History and Physical    Mindy Davies FAO:[id number] DOB: 09-13-1986 DOA: 02/09/2021  PCP: Patient, No Pcp Per   Patient coming from: Home  Chief Complaint: Flank Pain, LLQ Pain, Nausea and Vomiting   HPI: Mindy Davies is a 35 y.o. female with medical history significant of PCOS, history of urinary urgency and history of nephrolithiasis who presented to [facility] ED on 02/06/2021 for flank pain and vomiting.  This was felt to be related to kidney stones with low concern for pyelonephritis and urine cultures and urinalysis was sent.  Will treat with IV fluids and pain medication and discharged home without antibiotics for unclear reasons.  On 02/08/2021 urine culture showed greater than 100,000 colony-forming units of gram-negative rods and EDP call a prescription for Keflex with advice for the patient to return to the ED to rule out obstructing kidney stone with concurrent infection.  The final cultures grew out E. coli that is pansensitive only resistant to ampicillin.  She presents again today via EMS with complaints of left flank pain, lower quadrant abdominal pain that was not relieved by pain meds, nausea as well as having some vomiting and unable to tolerate p.o. intake.  In the ED she had a repeat CT of the abdomen and pelvis which showed multiple small bilateral nonobstructing renal calculi and a diffuse thickening and enhancement of the left ureteral wall and renal pelvis suspicious for urinary tract infection and left hydronephrosis and hydroureter without visualized obstructing calculus that could represent a passed calculus related to infection.  She also was noted to have periportal and perinephric edema potentially due to obstruction or infection but forniceal rupture was not excluded.  Transferred from med center [facility] to [facility] and accepted as an observation status interrogations will admit this patient for acute pyelonephritis.  Upon my evaluation she was extremely  somnolent and drowsy after she received several pain medications in the ED.  I spoke to her and she is still a little sleepy but understands that the urologist will come see her.  She denies any chest pain or shortness of breath.  No other concerns or complaints at this time.  ED Course: In the ED she was given 2 doses of IV morphine, a liter of lactated Ringer's, started on IV antibiotics with ceftriaxone and a Covid testing was done and negative. She had basic blood work done in the ED and CT Abd/Pelvis. Will place a Foley Catheter.   Review of Systems: As per HPI otherwise all other systems reviewed and negative.   Past Medical History:  Diagnosis Date  . History of kidney stones   . PCOS (polycystic ovarian syndrome)   . Renal calculi    bilateral  . Urgency of urination   . Wears contact lenses    Past Surgical History:  Procedure Laterality Date  . BUNIONECTOMY Bilateral age 47 and 5  . CYSTOSCOPY WITH RETROGRADE PYELOGRAM, URETEROSCOPY AND STENT PLACEMENT Bilateral 03/03/2018   Procedure: CYSTOSCOPY WITH BILATERAL RETROGRADE PYELOGRAM, LEFT  URETEROSCOPY AND BILATERAL STENT PLACEMENT WITH HOLMIUM LASER;  Surgeon: Jerilee Field, MD;  Location: [facility];  Service: Urology;  Laterality: Bilateral;  . CYSTOSCOPY/URETEROSCOPY/HOLMIUM LASER/STENT PLACEMENT Bilateral 03/14/2018   Procedure: CYSTOSCOPY/URETEROSCOPY/HOLMIUM LASER/STENT PLACEMENT;  Surgeon: Jerilee Field, MD;  Location: [facility];  Service: Urology;  Laterality: Bilateral;  ONLY NEEDS 60 MIN  . EXTRACORPOREAL SHOCK WAVE LITHOTRIPSY  2012 approx.  . WISDOM TOOTH EXTRACTION  2012   SOCIAL HISTORY  reports that she  has never smoked. She has never used smokeless tobacco. She reports previous alcohol use. She reports that she does not use drugs.  ALLERGIES No Known Allergies  Family History  Problem Relation Age of Onset  . Cancer Paternal Grandmother    Prior to Admission medications   Medication Sig  Start Date End Date Taking? Authorizing Provider  acetaminophen (TYLENOL) 500 MG tablet Take 500 mg by mouth every 6 (six) hours as needed for moderate pain.    [provider]  cephALEXin (KEFLEX) 500 MG capsule Take 1 capsule (500 mg total) by mouth 3 (three) times daily. 02/08/21   Rancour, Jeannett Senior, MD  oxyCODONE-acetaminophen (PERCOCET/ROXICET) 5-325 MG tablet Take 1 tablet by mouth every 6 (six) hours as needed for severe pain. 02/06/21   Renne Crigler, PA-C  tamsulosin (FLOMAX) 0.4 MG CAPS capsule Take 1 capsule (0.4 mg total) by mouth daily. 02/06/21   Renne Crigler, PA-C   Physical Exam: Vitals:   02/09/21 1431 02/09/21 1512 02/09/21 1707 02/09/21 1711  BP: 131/77 130/76 126/71   Pulse: 79 78 75   Resp: 18 18 16    Temp:   100 F (37.8 C)   TempSrc:   Oral   SpO2: 99% 98% 98%   Weight:    122.1 kg  Height:    5\' 7"  (1.702 m)   Constitutional: WN/WD morbidly obese AAF in NAD and appears calm and drowsy Eyes: Lids and conjunctivae normal, sclerae anicteric  ENMT: External Ears, Nose appear normal. Grossly normal hearing.  Neck: Appears normal, supple, no cervical masses, normal ROM, no appreciable thyromegaly; no JVD Respiratory: Diminished to auscultation bilaterally, no wheezing, rales, rhonchi or crackles. Normal respiratory effort and patient is not tachypenic. No accessory muscle use.  Cardiovascular: RRR, no murmurs / rubs / gallops. S1 and S2 auscultated. Abdomen: Soft, tender, Distended due to body habitus. Bowel sounds positive.  GU: Deferred. Musculoskeletal: No clubbing / cyanosis of digits/nails. No joint deformity upper and lower extremities.  Skin: No rashes, lesions, ulcers on a limited skin evaluation. No induration; Warm and dry.  Neurologic: CN 2-12 grossly intact with no focal deficits but she is very drowsy Psychiatric: Normal judgment and insight.  Very somnolent and drowsy likely in the setting of medication induced. normal mood and appropriate affect.    Labs on Admission: I have personally reviewed following labs and imaging studies  CBC: Recent Labs  Lab 02/06/21 0643 02/09/21 0933  WBC 7.8 14.7*  NEUTROABS 5.6 12.5*  HGB 14.1 13.3  HCT 43.7 39.5  MCV 94.4 91.2  PLT 302 227   Basic Metabolic Panel: Recent Labs  Lab 02/06/21 0643 02/09/21 0933  NA 139 135  K 3.8 3.4*  CL 105 98  CO2 23 25  GLUCOSE 99 105*  BUN 10 12  CREATININE 0.78 1.11*  CALCIUM 8.3* 9.0   GFR: Estimated Creatinine Clearance: 97.6 mL/min (A) (by C-G formula based on SCr of 1.11 mg/dL (H)). Liver Function Tests: Recent Labs  Lab 02/09/21 0933  AST 18  ALT 17  ALKPHOS 141*  BILITOT 1.0  PROT 8.0  ALBUMIN 3.2*   No results for input(s): LIPASE, AMYLASE in the last 168 hours. No results for input(s): AMMONIA in the last 168 hours. Coagulation Profile: No results for input(s): INR, PROTIME in the last 168 hours. Cardiac Enzymes: No results for input(s): CKTOTAL, CKMB, CKMBINDEX, TROPONINI in the last 168 hours. BNP (last 3 results) No results for input(s): PROBNP in the last 8760 hours. HbA1C: No results for  input(s): HGBA1C in the last 72 hours. CBG: No results for input(s): GLUCAP in the last 168 hours. Lipid Profile: No results for input(s): CHOL, HDL, LDLCALC, TRIG, CHOLHDL, LDLDIRECT in the last 72 hours. Thyroid Function Tests: No results for input(s): TSH, T4TOTAL, FREET4, T3FREE, THYROIDAB in the last 72 hours. Anemia Panel: No results for input(s): VITAMINB12, FOLATE, FERRITIN, TIBC, IRON, RETICCTPCT in the last 72 hours. Urine analysis:    Component Value Date/Time   COLORURINE YELLOW 02/06/2021 1243   APPEARANCEUR CLOUDY (A) 02/06/2021 1243   LABSPEC 1.016 02/06/2021 1243   PHURINE 9.0 (H) 02/06/2021 1243   GLUCOSEU NEGATIVE 02/06/2021 1243   HGBUR MODERATE (A) 02/06/2021 1243   BILIRUBINUR NEGATIVE 02/06/2021 1243   KETONESUR NEGATIVE 02/06/2021 1243   PROTEINUR NEGATIVE 02/06/2021 1243   UROBILINOGEN 0.2 02/22/2015  1116   NITRITE POSITIVE (A) 02/06/2021 1243   LEUKOCYTESUR MODERATE (A) 02/06/2021 1243   Sepsis Labs: !!!!!!!!!!!!!!!!!!!!!!!!!!!!!!!!!!!!!!!!!!!! @LABRCNTIP (procalcitonin:4,lacticidven:4) ) Recent Results (from the past 240 hour(s))  Urine Culture     Status: Abnormal   Collection Time: 02/06/21 12:43 PM   Specimen: Urine, Random  Result Value Ref Range Status   Specimen Description   Final    URINE, RANDOM Performed at [facility], [address] [place], [place]    Special Requests   Final    NONE Performed at [facility], [address], [place], [place]    Culture >=100,000 COLONIES/mL ESCHERICHIA COLI (A)  Final   Report Status 02/09/2021 FINAL  Final   Organism ID, Bacteria ESCHERICHIA COLI (A)  Final      Susceptibility   Escherichia coli - MIC*    AMPICILLIN >=32 RESISTANT Resistant     CEFAZOLIN <=4 SENSITIVE Sensitive     CEFEPIME <=0.12 SENSITIVE Sensitive     CEFTRIAXONE <=0.25 SENSITIVE Sensitive     CIPROFLOXACIN <=0.25 SENSITIVE Sensitive     GENTAMICIN <=1 SENSITIVE Sensitive     IMIPENEM <=0.25 SENSITIVE Sensitive     NITROFURANTOIN <=16 SENSITIVE Sensitive     TRIMETH/SULFA <=20 SENSITIVE Sensitive     AMPICILLIN/SULBACTAM 8 SENSITIVE Sensitive     PIP/TAZO <=4 SENSITIVE Sensitive     * >=100,000 COLONIES/mL ESCHERICHIA COLI  Resp Panel by RT-PCR (Flu A&B, Covid) Nasopharyngeal Swab     Status: None   Collection Time: 02/09/21  2:32 PM   Specimen: Nasopharyngeal Swab; Nasopharyngeal(NP) swabs in vial transport medium  Result Value Ref Range Status   SARS Coronavirus 2 by RT PCR NEGATIVE NEGATIVE Final    Comment: (NOTE) SARS-CoV-2 target nucleic acids are NOT DETECTED.  The SARS-CoV-2 RNA is generally detectable in upper respiratory specimens during the acute phase of infection. The lowest concentration of SARS-CoV-2 viral copies this assay can detect is 138 copies/mL. A negative result  does not preclude SARS-Cov-2 infection and should not be used as the sole basis for treatment or other patient management decisions. A negative result may occur with  improper specimen collection/handling, submission of specimen other than nasopharyngeal swab, presence of viral mutation(s) within the areas targeted by this assay, and inadequate number of viral copies(<138 copies/mL). A negative result must be combined with clinical observations, patient history, and epidemiological information. The expected result is Negative.  Fact Sheet for Patients:  02/11/21  Fact Sheet for Healthcare Providers:  BloggerCourse.com  This test is no t yet approved or cleared by the [place] FDA and  has been authorized for detection and/or diagnosis of SARS-CoV-2 by FDA  under an Emergency Use Authorization (EUA). This EUA will remain  in effect (meaning this test can be used) for the duration of the COVID-19 declaration under Section 564(b)(1) of the Act, 21 U.S.C.section 360bbb-3(b)(1), unless the authorization is terminated  or revoked sooner.       Influenza A by PCR NEGATIVE NEGATIVE Final   Influenza B by PCR NEGATIVE NEGATIVE Final    Comment: (NOTE) The Xpert Xpress SARS-CoV-2/FLU/RSV plus assay is intended as an aid in the diagnosis of influenza from Nasopharyngeal swab specimens and should not be used as a sole basis for treatment. Nasal washings and aspirates are unacceptable for Xpert Xpress SARS-CoV-2/FLU/RSV testing.  Fact Sheet for Patients: BloggerCourse.comhttps://www.fda.gov/media/152166/download  Fact Sheet for Healthcare Providers: SeriousBroker.ithttps://www.fda.gov/media/152162/download  This test is not yet approved or cleared by the [place] FDA and has been authorized for detection and/or diagnosis of SARS-CoV-2 by FDA under an Emergency Use Authorization (EUA). This EUA will remain in effect (meaning this test can be used) for  the duration of the COVID-19 declaration under Section 564(b)(1) of the Act, 21 U.S.C. section 360bbb-3(b)(1), unless the authorization is terminated or revoked.  Performed at [facility], [address] [place], [place] [id number]      Radiological Exams on Admission: CT ABDOMEN PELVIS W CONTRAST  Result Date: 02/09/2021 CLINICAL DATA:  LEFT flank and LEFT lower quadrant pain, fever, tenderness, question kidney stone, perinephric abscess EXAM: CT ABDOMEN AND PELVIS WITH CONTRAST TECHNIQUE: Multidetector CT imaging of the abdomen and pelvis was performed using the standard protocol following bolus administration of intravenous contrast. Sagittal and coronal MPR images reconstructed from axial data set. CONTRAST:  100mL OMNIPAQUE IOHEXOL 300 MG/ML SOLN IV. No oral contrast. COMPARISON:  03/03/2018 FINDINGS: Lower chest: Minimal atelectasis LEFT lung base. Hepatobiliary: Gallbladder and liver normal appearance Pancreas: Normal appearance Spleen: Normal appearance Adrenals/Urinary Tract: Adrenal glands normal appearance. Multiple small BILATERAL nonobstructing renal calculi. Mild enlargement of LEFT kidney with slight delay in LEFT nephrogram. Mild LEFT hydronephrosis and hydroureter extending to urinary bladder. Diffuse wall thickening of the ureteral wall and renal pelvis. No obstructing renal calculus is seen within the LEFT ureter. Significant peripelvic and mild perinephric edema of LEFT kidney. Bladder unremarkable. Stomach/Bowel: Normal appendix. Stomach and bowel loops normal appearance. Vascular/Lymphatic: Aorta normal caliber. Circumaortic LEFT renal vein. Upper normal sized LEFT para-aortic lymph node 10 mm short axis image 47. Enlarged portal caval node 17 mm image 34. Additional scattered normal sized LEFT para-aortic, aortocaval, and retroperitoneal nodes. Reproductive: Unremarkable uterus and ovaries for age. Other: No free air or free fluid.  No hernia. Musculoskeletal:  Unremarkable IMPRESSION: Multiple small BILATERAL nonobstructing renal calculi. Diffuse wall thickening and enhancement of the LEFT ureteral wall and renal pelvis suspicious for urinary tract infection; recommend correlation with urinalysis. LEFT hydronephrosis and hydroureter without visualized obstructing calculus, could represent passed calculus or be related to infection. Periportal and perinephric edema potentially due to obstruction or infection but forniceal rupture not excluded. Enlarged portal caval lymph node 17 mm short axis, nonspecific. Electronically Signed   By: Ulyses SouthwardMark  Boles M.D.   On: 02/09/2021 13:58   EKG: No EKG done on admission so will order one now.   Assessment/Plan Active Problems:   Acute pyelonephritis   E. coli UTI (urinary tract infection)   AKI (acute kidney injury) (HCC)   Hypokalemia  Acute E Coli Pyleonephritis complicated with non-obstructing Nephrolithiasis and Left Hydronephrosis and Hydroureter and possible Renal Forniceal Rupture -Admitted to Obs Med-Surge but will likely  need to be changed to Inpatient -CT Abd/Pelvis showed "Multiple small BILATERAL nonobstructing renal calculi. Diffuse wall thickening and enhancement of the LEFT ureteral wall and renal pelvis suspicious for urinary tract infection; recommend correlation with urinalysis. LEFT hydronephrosis and hydroureter without visualized obstructing calculus, could represent passed calculus or be related to infection. Periportal and perinephric edema potentially due to obstruction or infection but forniceal rupture not excluded. Enlarged portal caval lymph node 17 mm short axis, nonspecific." -Had a urinalysis on 02/06/2021 which showed a cloudy appearance with moderate hemoglobin, moderate leukocytes, positive nitrites, pH of 9, few bacteria, greater than 50 RBCs per high-power field, 6-10 squamous epithelial cells, 21-50 WBCs, and urine culture showed E. coli greater than 100,000 colony-forming units only  resistant to ampicillin -Check Blood Cx x2 -WBC on admission was 14.7 and up from 7.8 approximately 3 days ago -She was NOT septic on admission -EDP Discussed with Urology Dr. Annabell Howells; Will consult and get formal opinion -IVF started and will get NS at 100 mL/hr -Start Abx with IV Ceftriaxone -Continue to Monitor and Trend Cx's and continue IV Abx, IVF, and Follow Urology Recc's  -Having Pain and given IV Morphine 4 mg x2 in the ED and given Antiemetics with IV Zofran 4 mg  -Given 1 Liter of LR and will continue Maintenance IVF with NS at 100 mL/hr  AKI -In the setting of above -Patient's BUNs/creatinine went from 10/0.78 and now is 12/1.11 -IV fluid hydration and will avoid nephrotoxic medications, contrast dyes, hypotension renally dose medications -Given 1 Liter of LR and will continue Maintenance IVF with NS at 100 mL/hr -Repeat CMP in a.m. and may need a Foley catheter drainage  Hyperglycemia -Likely Reactive -Check HbA1c in the AM  -Continue to Monitor and Trend Blood Sugars and if Needed will place on Sensitive Novolog SSI AC  Hypokalemia -Patient's K+ was 3.4 -Replete with po KCl 40 mEQ BID x2 -Continue to Monitor and Replete as Necessary; Check Mag Level  -Repeat CMP in the AM   Morbid Obesity -Complicates overall prognosis and care -Estimated body mass index is 42.16 kg/m as calculated from the following:   Height as of this encounter: 5\' 7"  (1.702 m).   Weight as of this encounter: 122.1 kg. -Weight Loss and Dietary Counseling given   DVT prophylaxis: Enoxaparin 40 mg sq q24h Code Status: FULL CODE Family Communication: No family present at bedside  Disposition Plan: Pending further clinical Workup and Evaluation by Urology  Consults called: Urology Dr. Admission status: Observation Med-Surge  Severity of Illness: The appropriate patient status for this patient is OBSERVATION. Observation status is judged to be reasonable and necessary in order to provide  the required intensity of service to ensure the patient's safety. The patient's presenting symptoms, physical exam findings, and initial radiographic and laboratory data in the context of their medical condition is felt to place them at decreased risk for further clinical deterioration. Furthermore, it is anticipated that the patient will be medically stable for discharge from the hospital within 2 midnights of admission. The following factors support the patient status of observation.   " The patient's presenting symptoms include flank pain, LLQ pain, Nausea and Vomiting. " The physical exam findings include drowsiness . " The initial radiographic and laboratory data are as above.  Status is: Observation  The patient remains OBS appropriate and will d/c before 2 midnights.  Dispo: The patient is from: Home  Anticipated d/c is to: Home              Patient currently is not medically stable to d/c.   Difficult to place patient No  Marguerita Merles, D.O. [facility] PAGER is on AMION  If 7PM-7AM, please contact night-coverage [email or web address]  02/09/2021, 6:24 PM

## 2021-02-09 NOTE — Progress Notes (Addendum)
[facility] transfer acceptance note  Transferring facility: [facility] ED Transferring provider: Dr. Alvira Monday, EDP Accepted to [facility], medical bed under observation status.  35 year old female with history of kidney stones, recently seen at the [facility] ED on 02/06/2021 for flank pain and vomiting, felt to be related to kidney stones with low concern for pyelonephritis, urine cultures were sent, treated with IV fluids and pain meds and patient was discharged home.  On 3/13, urine culture showed >100 K colonies of gram-negative rods and an EDP called in prescription for Keflex with advice for patient to return to ED to rule out obstructing kidney stone with concurrent infection.  Patient presented to ED today via EMS with complaints of left flank pain, left lower quadrant abdominal pain, not relieved by pain meds, nausea, vomiting, unable to tolerate p.o.  Exam revealed low-grade fevers but other vital signs stable.  Lab work significant for potassium of 3.4, creatinine of 1.11, WBC 14.7 and normal lactate.  Serum pregnancy test negative.  Flu and Covid panel PCR results pending.  Blood cultures drawn and pending.  Recent urine culture from 3/11: >100 K colonies of E. coli, pansensitive except to ampicillin.  CT abdomen and pelvis with contrast results abnormal as noted below:   IMPRESSION: Multiple small BILATERAL nonobstructing renal calculi.  Diffuse wall thickening and enhancement of the LEFT ureteral wall and renal pelvis suspicious for urinary tract infection; recommend correlation with urinalysis.  LEFT hydronephrosis and hydroureter without visualized obstructing calculus, could represent passed calculus or be related to infection.  Periportal and perinephric edema potentially due to obstruction or infection but forniceal rupture not excluded.  Enlarged portal caval lymph node 17 mm short axis, nonspecific.   A/P: She likely has acute  pyelonephritis complicating possible passed ureteral calculi and left hydroureteronephrosis and possible forniceal rupture.  Patient has been treated with IV fluids and IV ceftriaxone.  EDP discussed with Dr. Annabell Howells, Urology who recommends admission for observation and evaluation.  Patient will need H&P and admit orders on arrival to [facility].  Consult Urology.  Marcellus Scott, MD, [place], [facility]  To contact the attending provider between 7A-7P or the covering provider during after hours 7P-7A, please log into the web site [email or web address] and access using universal [place] password for that web site. If you do not have the password, please call the hospital operator.

## 2021-02-09 NOTE — Op Note (Signed)
Procedure: 1.  Cystoscopy with left retrograde pyelogram and interpretation. 2.  Insertion of left double-J stent.  Preop diagnosis: Left hydronephrosis with febrile UTI.  Postop diagnosis: 1.  Left hydronephrosis with febrile UTI and ureteral narrowing in the intramural ureter and proximal ureter of uncertain etiology. 2.  Chronic follicular cystitis.  Surgeon: Dr. Bjorn Pippin.  Anesthesia: General.  Specimen: Urine from left renal pelvis for culture.  Drains: 6 French by 26 cm left contour double-J stent and 16 French Foley catheter.  EBL: None.  Complications: None.  Indications: The patient is a 35 year old female with a history of stones and prior left ureteroscopy and 2019 by Dr. Mena Goes.  She was seen in the emergency room last week with flank pain and fever and infected urine and was sent home on treatment for pyelonephritis.  She returns today with increased pain and a CT scan was done that demonstrated left hydronephrosis with perinephric fluid consistent with a forniceal rupture.  No ureteral stones were seen and there was dilation of the ureter to the bladder.  A Foley catheter was placed in the bladder was drained but a follow-up renal ultrasound today demonstrated persistent hydronephrosis so it was felt that stenting was indicated.  Procedure: She had been on Rocephin preoperatively with E. coli on culture from 311 sensitive to that agent.  A general anesthetic was induced.  She was placed in lithotomy position and fitted with PAS hose.  Her perineum and genitalia were prepped with Betadine solution and she was draped in usual sterile fashion.  Cystoscopy was performed using a 23 [place] scope and 30 degree lens.  Examination revealed a normal urethra.  The bladder wall had mild trabeculation with the extensive mucosal changes consistent with chronic follicular cystitis.  The right ureteral orifice was unremarkable.  The left ureteral orifice had erythema suggestive of recent  stone passage.  A left retrograde pyelogram was performed using a 5 [place] open-ended catheter and Omnipaque.  The left retrograde pyelogram demonstrated some slightly irregular narrowing of the distal 2 cm of ureter and then fusiform dilation of the mid and distal proximal ureter.  But there was a kink in the ureter at about the junction of the proximal and middle third of the ureter and above that the ureter was quite narrow leading into a tapered UPJ with moderate hydronephrosis.  An initial attempt was made to advance a sensor wire through the open-ended catheter to the kidney but once it passed the kink in the proximal ureter I was not confident that it was remaining within the ureteral lumen.  I then advanced an angled tipped Glidewire and was able to negotiate that into the renal pelvis after I was able to advance the open-ended catheter into the narrowed portion of the proximal ureter.  I then advanced the open-ended catheter into the renal pelvis and removed the Glidewire.  There was a brisk hydronephrotic drip confirming intrarenal placement.  A specimen was collected for culture.  The fluid became more turbid as drainage continued.  The sensor wire was then replaced through the open-ended catheter and the open-ended catheter was removed.  A 6 French by 26 cm contour double-J stent was then advanced over the wire to the kidney under fluoroscopic guidance.  The wire was then removed leaving a good coil in the kidney and a good coil in the bladder.  Observation of the distal stent loop after placement demonstrated brisk efflux of turbid of turbid urine.  The cystoscope was removed and a  16 French Foley catheter was reinserted.  The balloon was filled with 10 mL of sterile fluid.  The catheter was placed to straight drainage.  She was taken down from the lithotomy position, her anesthetic was reversed and she was moved recovery in stable condition.  There were no complications.

## 2021-02-09 NOTE — ED Notes (Signed)
Medicated as per order, safety measures in place, call bell next to client, pt instructed not to get up off stretcher without staff in room, cont on cont POX monitoring with int NBP assessments

## 2021-02-09 NOTE — Anesthesia Preprocedure Evaluation (Signed)
Anesthesia Evaluation  Patient identified by MRN, date of birth, ID band Patient awake    Reviewed: Allergy & Precautions, NPO status , Patient's Chart, lab work & pertinent test results  Airway Mallampati: III  TM Distance: >3 FB Neck ROM: Full    Dental no notable dental hx. (+) Teeth Intact, Dental Advisory Given   Pulmonary neg pulmonary ROS,    Pulmonary exam normal breath sounds clear to auscultation       Cardiovascular negative cardio ROS Normal cardiovascular exam Rhythm:Regular Rate:Normal     Neuro/Psych negative neurological ROS  negative psych ROS   GI/Hepatic negative GI ROS, Neg liver ROS,   Endo/Other  Morbid obesity  Renal/GU Renal diseaseBilateral renal stones, Left ureteral stone  negative genitourinary   Musculoskeletal negative musculoskeletal ROS (+)   Abdominal (+) + obese,   Peds  Hematology negative hematology ROS (+)   Anesthesia Other Findings   Reproductive/Obstetrics                             Anesthesia Physical  Anesthesia Plan  ASA: III and emergent  Anesthesia Plan: General   Post-op Pain Management:    Induction: Intravenous  PONV Risk Score and Plan: 3 and Midazolam, Dexamethasone, Ondansetron and Treatment may vary due to age or medical condition  Airway Management Planned: LMA  Additional Equipment:   Intra-op Plan:   Post-operative Plan: Extubation in OR  Informed Consent: I have reviewed the patients History and Physical, chart, labs and discussed the procedure including the risks, benefits and alternatives for the proposed anesthesia with the patient or authorized representative who has indicated his/her understanding and acceptance.     Dental advisory given  Plan Discussed with: Anesthesiologist, CRNA and Surgeon  Anesthesia Plan Comments:         Anesthesia Quick Evaluation

## 2021-02-09 NOTE — Consult Note (Signed)
Subjective: CC: Left flank pain and low grade fever.    Consult requested by Omair Latif Sheikh.  Mindy Davies is a 34 yo female with a history of stones requiring left ureteroscopy by Dr. Eskridge in 2019 and ESWL in 2012.  She was initially seen in the [facility] ER on 02/06/21 for left flank pain and fever and was treated for pyelonephritis.  She returned to [facility] today with worsening symptoms.  Her culture grew 100K GNR and keflex was called in on 3/13.  She had increased left flank and LLQ pain that was intractable.  She had a low grade fever with a minimally elevated Cr of 1.11 and a WBC count of 14.7.  Lactate is normal.  CT showed left hydronephrosis with perinephric fluid consistent with a forniceal rupture and some ureteral wall thickening suggestive of infection.  There was small bilateral stones.  The hydro was to the UVJ and it was felt that she might have recently passed a stone or could have obstruction related to infection.  She remains in pain and is very subdued at this time.  She has a foley draining concentrated urine.  She didn't report voiding issues.  ROS:  ROS  No Known Allergies  Past Medical History:  Diagnosis Date  . History of kidney stones   . PCOS (polycystic ovarian syndrome)   . Renal calculi    bilateral  . Urgency of urination   . Wears contact lenses     Past Surgical History:  Procedure Laterality Date  . BUNIONECTOMY Bilateral age 14 and 15  . CYSTOSCOPY WITH RETROGRADE PYELOGRAM, URETEROSCOPY AND STENT PLACEMENT Bilateral 03/03/2018   Procedure: CYSTOSCOPY WITH BILATERAL RETROGRADE PYELOGRAM, LEFT  URETEROSCOPY AND BILATERAL STENT PLACEMENT WITH HOLMIUM LASER;  Surgeon: Eskridge, Matthew, MD;  Location: [facility];  Service: Urology;  Laterality: Bilateral;  . CYSTOSCOPY/URETEROSCOPY/HOLMIUM LASER/STENT PLACEMENT Bilateral 03/14/2018   Procedure: CYSTOSCOPY/URETEROSCOPY/HOLMIUM LASER/STENT PLACEMENT;  Surgeon: Eskridge, Matthew, MD;  Location: [facility];  Service: Urology;  Laterality: Bilateral;  ONLY NEEDS 60 MIN  . EXTRACORPOREAL SHOCK WAVE LITHOTRIPSY  2012 approx.  . WISDOM TOOTH EXTRACTION  2012    Social History   Socioeconomic History  . Marital status: Single    Spouse name: Not on file  . Number of children: Not on file  . Years of education: Not on file  . Highest education level: Not on file  Occupational History  . Not on file  Tobacco Use  . Smoking status: Never Smoker  . Smokeless tobacco: Never Used  Vaping Use  . Vaping Use: Never used  Substance and Sexual Activity  . Alcohol use: Not Currently  . Drug use: No  . Sexual activity: Yes    Birth control/protection: None  Other Topics Concern  . Not on file  Social History Narrative  . Not on file   Social Determinants of Health   Financial Resource Strain: Not on file  Food Insecurity: Not on file  Transportation Needs: Not on file  Physical Activity: Not on file  Stress: Not on file  Social Connections: Not on file  Intimate Partner Violence: Not on file    Family History  Problem Relation Age of Onset  . Cancer Paternal Grandmother     Anti-infectives: Anti-infectives (From admission, onward)   Start     Dose/Rate Route Frequency Ordered Stop   02/09/21 2200  cefTRIAXone (ROCEPHIN) 2 g in sodium chloride 0.9 % 100 mL IVPB          2 g 200 mL/hr over 30 Minutes Intravenous Every 24 hours 02/09/21 1817     02/09/21 0945  cefTRIAXone (ROCEPHIN) 1 g in sodium chloride 0.9 % 100 mL IVPB        1 g 200 mL/hr over 30 Minutes Intravenous  Once 02/09/21 4627 02/09/21 1044      Current Facility-Administered Medications  Medication Dose Route Frequency Provider Last Rate Last Admin  . 0.9 %  sodium chloride infusion   Intravenous Continuous Sheikh, Kateri Mc Latif, DO      . acetaminophen (TYLENOL) tablet 650 mg  650 mg Oral Q6H PRN Marguerita Merles Latif, DO       Or  . acetaminophen (TYLENOL) suppository 650 mg  650 mg Rectal Q6H PRN  Marguerita Merles Latif, DO      . bisacodyl (DULCOLAX) suppository 10 mg  10 mg Rectal Daily PRN Marland Mcalpine, Omair Latif, DO      . cefTRIAXone (ROCEPHIN) 2 g in sodium chloride 0.9 % 100 mL IVPB  2 g Intravenous Q24H Sheikh, Omair Latif, DO      . fentaNYL (SUBLIMAZE) injection 12.5 mcg  12.5 mcg Intravenous Q2H PRN Sheikh, Kateri Mc Latif, DO      . heparin injection 5,000 Units  5,000 Units Subcutaneous Q8H Sheikh, Omair Latif, DO      . hydrALAZINE (APRESOLINE) injection 10 mg  10 mg Intravenous Q6H PRN Sheikh, Omair Latif, DO      . HYDROcodone-acetaminophen (NORCO/VICODIN) 5-325 MG per tablet 1-2 tablet  1-2 tablet Oral Q4H PRN Sheikh, Omair Latif, DO      . ondansetron [facility]) tablet 4 mg  4 mg Oral Q6H PRN Marguerita Merles Latif, DO       Or  . ondansetron [facility]) injection 4 mg  4 mg Intravenous Q6H PRN Sheikh, Omair Latif, DO      . polyethylene glycol (MIRALAX / GLYCOLAX) packet 17 g  17 g Oral Daily PRN Marland Mcalpine, Omair Latif, DO      . tamsulosin (FLOMAX) capsule 0.4 mg  0.4 mg Oral Daily Sheikh, Omair Latif, DO         Objective: Vital signs in last 24 hours: BP 126/71   Pulse 75   Temp 100 F (37.8 C) (Oral)   Resp 16   Ht 5\' 7"  (1.702 m)   Wt 122.1 kg   LMP 01/12/2021 Comment: Neg HCG  SpO2 98%   BMI 42.16 kg/m   Intake/Output from previous day: No intake/output data recorded. Intake/Output this shift: No intake/output data recorded.   Physical Exam Vitals reviewed.  Constitutional:      Appearance: She is well-developed. She is obese.  Cardiovascular:     Rate and Rhythm: Normal rate and regular rhythm.     Heart sounds: Normal heart sounds.  Pulmonary:     Effort: Pulmonary effort is normal. No respiratory distress.     Breath sounds: Normal breath sounds.  Abdominal:     Palpations: Abdomen is soft.     Tenderness: There is abdominal tenderness in the left upper quadrant.     Comments: Her tenderness is diffuse but worse in the LUQ and flank.   Skin:    General:  Skin is warm.  Neurological:     General: No focal deficit present.     Mental Status: She is alert and oriented to person, place, and time.     Lab Results:  Results for orders placed or performed during the hospital encounter of 02/09/21 (from the past 24 hour(s))  CBC with Differential     Status: Abnormal   Collection Time: 02/09/21  9:33 AM  Result Value Ref Range   WBC 14.7 (H) 4.0 - 10.5 K/uL   RBC 4.33 3.87 - 5.11 MIL/uL   Hemoglobin 13.3 12.0 - 15.0 g/dL   HCT 58.0 99.8 - 33.8 %   MCV 91.2 80.0 - 100.0 fL   MCH 30.7 26.0 - 34.0 pg   MCHC 33.7 30.0 - 36.0 g/dL   RDW 25.0 53.9 - 76.7 %   Platelets 227 150 - 400 K/uL   nRBC 0.0 0.0 - 0.2 %   Neutrophils Relative % 86 %   Neutro Abs 12.5 (H) 1.7 - 7.7 K/uL   Lymphocytes Relative 8 %   Lymphs Abs 1.2 0.7 - 4.0 K/uL   Monocytes Relative 6 %   Monocytes Absolute 0.9 0.1 - 1.0 K/uL   Eosinophils Relative 0 %   Eosinophils Absolute 0.0 0.0 - 0.5 K/uL   Basophils Relative 0 %   Basophils Absolute 0.0 0.0 - 0.1 K/uL   Immature Granulocytes 0 %   Abs Immature Granulocytes 0.06 0.00 - 0.07 K/uL  Comprehensive metabolic panel     Status: Abnormal   Collection Time: 02/09/21  9:33 AM  Result Value Ref Range   Sodium 135 135 - 145 mmol/L   Potassium 3.4 (L) 3.5 - 5.1 mmol/L   Chloride 98 98 - 111 mmol/L   CO2 25 22 - 32 mmol/L   Glucose, Bld 105 (H) 70 - 99 mg/dL   BUN 12 6 - 20 mg/dL   Creatinine, Ser 3.41 (H) 0.44 - 1.00 mg/dL   Calcium 9.0 8.9 - 93.7 mg/dL   Total Protein 8.0 6.5 - 8.1 g/dL   Albumin 3.2 (L) 3.5 - 5.0 g/dL   AST 18 15 - 41 U/L   ALT 17 0 - 44 U/L   Alkaline Phosphatase 141 (H) 38 - 126 U/L   Total Bilirubin 1.0 0.3 - 1.2 mg/dL   GFR, Estimated >90 >24 mL/min   Anion gap 12 5 - 15  Lactic acid, plasma     Status: None   Collection Time: 02/09/21 10:00 AM  Result Value Ref Range   Lactic Acid, Venous 1.0 0.5 - 1.9 mmol/L  hCG, serum, qualitative     Status: None   Collection Time: 02/09/21 12:16 PM   Result Value Ref Range   Preg, Serum NEGATIVE NEGATIVE  Resp Panel by RT-PCR (Flu A&B, Covid) Nasopharyngeal Swab     Status: None   Collection Time: 02/09/21  2:32 PM   Specimen: Nasopharyngeal Swab; Nasopharyngeal(NP) swabs in vial transport medium  Result Value Ref Range   SARS Coronavirus 2 by RT PCR NEGATIVE NEGATIVE   Influenza A by PCR NEGATIVE NEGATIVE   Influenza B by PCR NEGATIVE NEGATIVE    BMET Recent Labs    02/09/21 0933  NA 135  K 3.4*  CL 98  CO2 25  GLUCOSE 105*  BUN 12  CREATININE 1.11*  CALCIUM 9.0   PT/INR No results for input(s): LABPROT, INR in the last 72 hours. ABG No results for input(s): PHART, HCO3 in the last 72 hours.  Invalid input(s): PCO2, PO2  Studies/Results: CT ABDOMEN PELVIS W CONTRAST  Result Date: 02/09/2021 CLINICAL DATA:  LEFT flank and LEFT lower quadrant pain, fever, tenderness, question kidney stone, perinephric abscess EXAM: CT ABDOMEN AND PELVIS WITH CONTRAST TECHNIQUE: Multidetector CT imaging of the abdomen and pelvis was performed using the standard protocol following  bolus administration of intravenous contrast. Sagittal and coronal MPR images reconstructed from axial data set. CONTRAST:  OMNIPAQUE IOHEXOL 300 MG/ML SOLN IV. No oral contrast. COMPARISON:  03/03/2018 FINDINGS: Lower chest: Minimal atelectasis LEFT lung base. Hepatobiliary: Gallbladder and liver normal appearance Pancreas: Normal appearance Spleen: Normal appearance Adrenals/Urinary Tract: Adrenal glands normal appearance. Multiple small BILATERAL nonobstructing renal calculi. Mild enlargement of LEFT kidney with slight delay in LEFT nephrogram. Mild LEFT hydronephrosis and hydroureter extending to urinary bladder. Diffuse wall thickening of the ureteral wall and renal pelvis. No obstructing renal calculus is seen within the LEFT ureter. Significant peripelvic and mild perinephric edema of LEFT kidney. Bladder unremarkable. Stomach/Bowel: Normal appendix. Stomach  and bowel loops normal appearance. Vascular/Lymphatic: Aorta normal caliber. Circumaortic LEFT renal vein. Upper normal sized LEFT para-aortic lymph node 10 mm short axis image 47. Enlarged portal caval node 17 mm image 34. Additional scattered normal sized LEFT para-aortic, aortocaval, and retroperitoneal nodes. Reproductive: Unremarkable uterus and ovaries for age. Other: No free air or free fluid.  No hernia. Musculoskeletal: Unremarkable IMPRESSION: Multiple small BILATERAL nonobstructing renal calculi. Diffuse wall thickening and enhancement of the LEFT ureteral wall and renal pelvis suspicious for urinary tract infection; recommend correlation with urinalysis. LEFT hydronephrosis and hydroureter without visualized obstructing calculus, could represent passed calculus or be related to infection. Periportal and perinephric edema potentially due to obstruction or infection but forniceal rupture not excluded. Enlarged portal caval lymph node 17 mm short axis, nonspecific. Electronically Signed   By: Ulyses Southward M.D.   On: 02/09/2021 13:58     Assessment/Plan: Left hydroureteronephrosis to the bladder with perinephric fluid consistent with a forniceal rupture.  No stone is seen in the urine so the findings could be secondary to a recently passed stone or possibility tissue debris.   I have recommended a foley catheter and renal US and if the hydro persists, she will need cystoscopy with left ureteral stenting.  I will make her NPO and reviewed the risks of the procedure including bleeding, infection, ureteral injury, need for secondary procedures,thrombotic events and anesthetic complications.   UTI with pyelonephritis.  She has a low grade fever with normal VS at this time.  Continue antibiotic therapy.  Bilateral renal stones.  She has small bilateral renal stones that will need f/u ongoing.         No follow-ups on file.    CC: Dr. Merlene Laughter.      Bjorn Pippin 02/09/2021 [id number]

## 2021-02-09 NOTE — ED Notes (Signed)
Pt unable to provide urine sample at this time 

## 2021-02-09 NOTE — H&P (View-Only) (Signed)
Subjective: CC: Left flank pain and low grade fever.    Consult requested by Merlene Laughtermair Latif Sheikh.  Mindy Davies is a 35 yo female with a history of stones requiring left ureteroscopy by Dr. Mena GoesEskridge in 2019 and ESWL in 2012.  [facility] for left flank pain and fever and was treated for pyelonephritis.  [facility] returned [facility] today with worsening symptoms.  Her culture grew 100K GNR and keflex was called in on 3/13.  She had increased left flank and LLQ pain that was intractable.  She had a low grade fever with a minimally elevated Cr of 1.11 and a WBC count of 14.7.  Lactate is normal.  CT showed left hydronephrosis with perinephric fluid consistent with a forniceal rupture and some ureteral wall thickening suggestive of infection.  There was small bilateral stones.  The hydro was to the UVJ and it was felt that she might have recently passed a stone or could have obstruction related to infection.  She remains in pain and is very subdued at this time.  She has a foley draining concentrated urine.  She didn't report voiding issues.  ROS:  ROS  No Known Allergies  Past Medical History:  Diagnosis Date  . History of kidney stones   . PCOS (polycystic ovarian syndrome)   . Renal calculi    bilateral  . Urgency of urination   . Wears contact lenses     Past Surgical History:  Procedure Laterality Date  . BUNIONECTOMY Bilateral age 35 and 89  . CYSTOSCOPY WITH RETROGRADE PYELOGRAM, URETEROSCOPY AND STENT PLACEMENT Bilateral 03/03/2018   Procedure: CYSTOSCOPY WITH BILATERAL RETROGRADE PYELOGRAM, LEFT  URETEROSCOPY AND BILATERAL STENT PLACEMENT WITH HOLMIUM LASER;  Surgeon: Jerilee FieldEskridge, Matthew, MD;  Location: [facility];  Service: Urology;  Laterality: Bilateral;  . CYSTOSCOPY/URETEROSCOPY/HOLMIUM LASER/STENT PLACEMENT Bilateral 03/14/2018   Procedure: CYSTOSCOPY/URETEROSCOPY/HOLMIUM LASER/STENT PLACEMENT;  Surgeon: Jerilee FieldEskridge, Matthew, MD;  [facility];  Service: Urology;  Laterality: Bilateral;  ONLY NEEDS 60 MIN  . EXTRACORPOREAL SHOCK WAVE LITHOTRIPSY  2012 approx.  . WISDOM TOOTH EXTRACTION  2012    Social History   Socioeconomic History  . Marital status: Single    Spouse name: Not on file  . Number of children: Not on file  . Years of education: Not on file  . Highest education level: Not on file  Occupational History  . Not on file  Tobacco Use  . Smoking status: Never Smoker  . Smokeless tobacco: Never Used  Vaping Use  . Vaping Use: Never used  Substance and Sexual Activity  . Alcohol use: Not Currently  . Drug use: No  . Sexual activity: Yes    Birth control/protection: None  Other Topics Concern  . Not on file  Social History Narrative  . Not on file   Social Determinants of Health   Financial Resource Strain: Not on file  Food Insecurity: Not on file  Transportation Needs: Not on file  Physical Activity: Not on file  Stress: Not on file  Social Connections: Not on file  Intimate Partner Violence: Not on file    Family History  Problem Relation Age of Onset  . Cancer Paternal Grandmother     Anti-infectives: Anti-infectives (From admission, onward)   Start     Dose/Rate Route Frequency Ordered Stop   02/09/21 2200  cefTRIAXone (ROCEPHIN) 2 g in sodium chloride 0.9 % 100 mL IVPB  2 g 200 mL/hr over 30 Minutes Intravenous Every 24 hours 02/09/21 1817     02/09/21 0945  cefTRIAXone (ROCEPHIN) 1 g in sodium chloride 0.9 % 100 mL IVPB        1 g 200 mL/hr over 30 Minutes Intravenous  Once 02/09/21 4627 02/09/21 1044      Current Facility-Administered Medications  Medication Dose Route Frequency Provider Last Rate Last Admin  . 0.9 %  sodium chloride infusion   Intravenous Continuous Sheikh, Kateri Mc Latif, DO      . acetaminophen (TYLENOL) tablet 650 mg  650 mg Oral Q6H PRN Marguerita Merles Latif, DO       Or  . acetaminophen (TYLENOL) suppository 650 mg  650 mg Rectal Q6H PRN  Marguerita Merles Latif, DO      . bisacodyl (DULCOLAX) suppository 10 mg  10 mg Rectal Daily PRN Marland Mcalpine, Omair Latif, DO      . cefTRIAXone (ROCEPHIN) 2 g in sodium chloride 0.9 % 100 mL IVPB  2 g Intravenous Q24H Sheikh, Omair Latif, DO      . fentaNYL (SUBLIMAZE) injection 12.5 mcg  12.5 mcg Intravenous Q2H PRN Sheikh, Kateri Mc Latif, DO      . heparin injection 5,000 Units  5,000 Units Subcutaneous Q8H Sheikh, Omair Latif, DO      . hydrALAZINE (APRESOLINE) injection 10 mg  10 mg Intravenous Q6H PRN Sheikh, Omair Latif, DO      . HYDROcodone-acetaminophen (NORCO/VICODIN) 5-325 MG per tablet 1-2 tablet  1-2 tablet Oral Q4H PRN Sheikh, Omair Latif, DO      . ondansetron [facility]) tablet 4 mg  4 mg Oral Q6H PRN Marguerita Merles Latif, DO       Or  . ondansetron [facility]) injection 4 mg  4 mg Intravenous Q6H PRN Sheikh, Omair Latif, DO      . polyethylene glycol (MIRALAX / GLYCOLAX) packet 17 g  17 g Oral Daily PRN Marland Mcalpine, Omair Latif, DO      . tamsulosin (FLOMAX) capsule 0.4 mg  0.4 mg Oral Daily Sheikh, Omair Latif, DO         Objective: Vital signs in last 24 hours: BP 126/71   Pulse 75   Temp 100 F (37.8 C) (Oral)   Resp 16   Ht 5\' 7"  (1.702 m)   Wt 122.1 kg   LMP 01/12/2021 Comment: Neg HCG  SpO2 98%   BMI 42.16 kg/m   Intake/Output from previous day: No intake/output data recorded. Intake/Output this shift: No intake/output data recorded.   Physical Exam Vitals reviewed.  Constitutional:      Appearance: She is well-developed. She is obese.  Cardiovascular:     Rate and Rhythm: Normal rate and regular rhythm.     Heart sounds: Normal heart sounds.  Pulmonary:     Effort: Pulmonary effort is normal. No respiratory distress.     Breath sounds: Normal breath sounds.  Abdominal:     Palpations: Abdomen is soft.     Tenderness: There is abdominal tenderness in the left upper quadrant.     Comments: Her tenderness is diffuse but worse in the LUQ and flank.   Skin:    General:  Skin is warm.  Neurological:     General: No focal deficit present.     Mental Status: She is alert and oriented to person, place, and time.     Lab Results:  Results for orders placed or performed during the hospital encounter of 02/09/21 (from the past 24 hour(s))  CBC with Differential     Status: Abnormal   Collection Time: 02/09/21  9:33 AM  Result Value Ref Range   WBC 14.7 (H) 4.0 - 10.5 K/uL   RBC 4.33 3.87 - 5.11 MIL/uL   Hemoglobin 13.3 12.0 - 15.0 g/dL   HCT 58.0 99.8 - 33.8 %   MCV 91.2 80.0 - 100.0 fL   MCH 30.7 26.0 - 34.0 pg   MCHC 33.7 30.0 - 36.0 g/dL   RDW 25.0 53.9 - 76.7 %   Platelets 227 150 - 400 K/uL   nRBC 0.0 0.0 - 0.2 %   Neutrophils Relative % 86 %   Neutro Abs 12.5 (H) 1.7 - 7.7 K/uL   Lymphocytes Relative 8 %   Lymphs Abs 1.2 0.7 - 4.0 K/uL   Monocytes Relative 6 %   Monocytes Absolute 0.9 0.1 - 1.0 K/uL   Eosinophils Relative 0 %   Eosinophils Absolute 0.0 0.0 - 0.5 K/uL   Basophils Relative 0 %   Basophils Absolute 0.0 0.0 - 0.1 K/uL   Immature Granulocytes 0 %   Abs Immature Granulocytes 0.06 0.00 - 0.07 K/uL  Comprehensive metabolic panel     Status: Abnormal   Collection Time: 02/09/21  9:33 AM  Result Value Ref Range   Sodium 135 135 - 145 mmol/L   Potassium 3.4 (L) 3.5 - 5.1 mmol/L   Chloride 98 98 - 111 mmol/L   CO2 25 22 - 32 mmol/L   Glucose, Bld 105 (H) 70 - 99 mg/dL   BUN 12 6 - 20 mg/dL   Creatinine, Ser 3.41 (H) 0.44 - 1.00 mg/dL   Calcium 9.0 8.9 - 93.7 mg/dL   Total Protein 8.0 6.5 - 8.1 g/dL   Albumin 3.2 (L) 3.5 - 5.0 g/dL   AST 18 15 - 41 U/L   ALT 17 0 - 44 U/L   Alkaline Phosphatase 141 (H) 38 - 126 U/L   Total Bilirubin 1.0 0.3 - 1.2 mg/dL   GFR, Estimated >90 >24 mL/min   Anion gap 12 5 - 15  Lactic acid, plasma     Status: None   Collection Time: 02/09/21 10:00 AM  Result Value Ref Range   Lactic Acid, Venous 1.0 0.5 - 1.9 mmol/L  hCG, serum, qualitative     Status: None   Collection Time: 02/09/21 12:16 PM   Result Value Ref Range   Preg, Serum NEGATIVE NEGATIVE  Resp Panel by RT-PCR (Flu A&B, Covid) Nasopharyngeal Swab     Status: None   Collection Time: 02/09/21  2:32 PM   Specimen: Nasopharyngeal Swab; Nasopharyngeal(NP) swabs in vial transport medium  Result Value Ref Range   SARS Coronavirus 2 by RT PCR NEGATIVE NEGATIVE   Influenza A by PCR NEGATIVE NEGATIVE   Influenza B by PCR NEGATIVE NEGATIVE    BMET Recent Labs    02/09/21 0933  NA 135  K 3.4*  CL 98  CO2 25  GLUCOSE 105*  BUN 12  CREATININE 1.11*  CALCIUM 9.0   PT/INR No results for input(s): LABPROT, INR in the last 72 hours. ABG No results for input(s): PHART, HCO3 in the last 72 hours.  Invalid input(s): PCO2, PO2  Studies/Results: CT ABDOMEN PELVIS W CONTRAST  Result Date: 02/09/2021 CLINICAL DATA:  LEFT flank and LEFT lower quadrant pain, fever, tenderness, question kidney stone, perinephric abscess EXAM: CT ABDOMEN AND PELVIS WITH CONTRAST TECHNIQUE: Multidetector CT imaging of the abdomen and pelvis was performed using the standard protocol following  bolus administration of intravenous contrast. Sagittal and coronal MPR images reconstructed from axial data set. CONTRAST:  OMNIPAQUE IOHEXOL 300 MG/ML SOLN IV. No oral contrast. COMPARISON:  03/03/2018 FINDINGS: Lower chest: Minimal atelectasis LEFT lung base. Hepatobiliary: Gallbladder and liver normal appearance Pancreas: Normal appearance Spleen: Normal appearance Adrenals/Urinary Tract: Adrenal glands normal appearance. Multiple small BILATERAL nonobstructing renal calculi. Mild enlargement of LEFT kidney with slight delay in LEFT nephrogram. Mild LEFT hydronephrosis and hydroureter extending to urinary bladder. Diffuse wall thickening of the ureteral wall and renal pelvis. No obstructing renal calculus is seen within the LEFT ureter. Significant peripelvic and mild perinephric edema of LEFT kidney. Bladder unremarkable. Stomach/Bowel: Normal appendix. Stomach  and bowel loops normal appearance. Vascular/Lymphatic: Aorta normal caliber. Circumaortic LEFT renal vein. Upper normal sized LEFT para-aortic lymph node 10 mm short axis image 47. Enlarged portal caval node 17 mm image 34. Additional scattered normal sized LEFT para-aortic, aortocaval, and retroperitoneal nodes. Reproductive: Unremarkable uterus and ovaries for age. Other: No free air or free fluid.  No hernia. Musculoskeletal: Unremarkable IMPRESSION: Multiple small BILATERAL nonobstructing renal calculi. Diffuse wall thickening and enhancement of the LEFT ureteral wall and renal pelvis suspicious for urinary tract infection; recommend correlation with urinalysis. LEFT hydronephrosis and hydroureter without visualized obstructing calculus, could represent passed calculus or be related to infection. Periportal and perinephric edema potentially due to obstruction or infection but forniceal rupture not excluded. Enlarged portal caval lymph node 17 mm short axis, nonspecific. Electronically Signed   By: Ulyses Southward M.D.   On: 02/09/2021 13:58     Assessment/Plan: Left hydroureteronephrosis to the bladder with perinephric fluid consistent with a forniceal rupture.  No stone is seen in the urine so the findings could be secondary to a recently passed stone or possibility tissue debris.   I have recommended a foley catheter and renal US and if the hydro persists, she will need cystoscopy with left ureteral stenting.  I will make her NPO and reviewed the risks of the procedure including bleeding, infection, ureteral injury, need for secondary procedures,thrombotic events and anesthetic complications.   UTI with pyelonephritis.  She has a low grade fever with normal VS at this time.  Continue antibiotic therapy.  Bilateral renal stones.  She has small bilateral renal stones that will need f/u ongoing.         No follow-ups on file.    CC: Dr. Merlene Laughter.      Bjorn Pippin 02/09/2021 [id number]

## 2021-02-09 NOTE — H&P (View-Only) (Signed)
Subjective: CC: Left flank pain and low grade fever.    Consult requested by Omair Latif Sheikh.  Mindy Davies is a 34 yo female with a history of stones requiring left ureteroscopy by Dr. Eskridge in 2019 and ESWL in 2012.  She was initially seen in the [facility] on 02/06/21 for left flank pain and fever and was treated for pyelonephritis.  She returned to [facility] today with worsening symptoms.  Her culture grew 100K GNR and keflex was called in on 3/13.  She had increased left flank and LLQ pain that was intractable.  She had a low grade fever with a minimally elevated Cr of 1.11 and a WBC count of 14.7.  Lactate is normal.  CT showed left hydronephrosis with perinephric fluid consistent with a forniceal rupture and some ureteral wall thickening suggestive of infection.  There was small bilateral stones.  The hydro was to the UVJ and it was felt that she might have recently passed a stone or could have obstruction related to infection.  She remains in pain and is very subdued at this time.  She has a foley draining concentrated urine.  She didn't report voiding issues.  ROS:  ROS  No Known Allergies  Past Medical History:  Diagnosis Date  . History of kidney stones   . PCOS (polycystic ovarian syndrome)   . Renal calculi    bilateral  . Urgency of urination   . Wears contact lenses     Past Surgical History:  Procedure Laterality Date  . BUNIONECTOMY Bilateral age 14 and 15  . CYSTOSCOPY WITH RETROGRADE PYELOGRAM, URETEROSCOPY AND STENT PLACEMENT Bilateral 03/03/2018   Procedure: CYSTOSCOPY WITH BILATERAL RETROGRADE PYELOGRAM, LEFT  URETEROSCOPY AND BILATERAL STENT PLACEMENT WITH HOLMIUM LASER;  Surgeon: Eskridge, Matthew, MD;  Location: [facility];  Service: Urology;  Laterality: Bilateral;  . CYSTOSCOPY/URETEROSCOPY/HOLMIUM LASER/STENT PLACEMENT Bilateral 03/14/2018   Procedure: CYSTOSCOPY/URETEROSCOPY/HOLMIUM LASER/STENT PLACEMENT;  Surgeon: Eskridge, Matthew, MD;  Location: [facility];  Service: Urology;  Laterality: Bilateral;  ONLY NEEDS 60 MIN  . EXTRACORPOREAL SHOCK WAVE LITHOTRIPSY  2012 approx.  . WISDOM TOOTH EXTRACTION  2012    Social History   Socioeconomic History  . Marital status: Single    Spouse name: Not on file  . Number of children: Not on file  . Years of education: Not on file  . Highest education level: Not on file  Occupational History  . Not on file  Tobacco Use  . Smoking status: Never Smoker  . Smokeless tobacco: Never Used  Vaping Use  . Vaping Use: Never used  Substance and Sexual Activity  . Alcohol use: Not Currently  . Drug use: No  . Sexual activity: Yes    Birth control/protection: None  Other Topics Concern  . Not on file  Social History Narrative  . Not on file   Social Determinants of Health   Financial Resource Strain: Not on file  Food Insecurity: Not on file  Transportation Needs: Not on file  Physical Activity: Not on file  Stress: Not on file  Social Connections: Not on file  Intimate Partner Violence: Not on file    Family History  Problem Relation Age of Onset  . Cancer Paternal Grandmother     Anti-infectives: Anti-infectives (From admission, onward)   Start     Dose/Rate Route Frequency Ordered Stop   02/09/21 2200  cefTRIAXone (ROCEPHIN) 2 g in sodium chloride 0.9 % 100 mL IVPB          2 g 200 mL/hr over 30 Minutes Intravenous Every 24 hours 02/09/21 1817     02/09/21 0945  cefTRIAXone (ROCEPHIN) 1 g in sodium chloride 0.9 % 100 mL IVPB        1 g 200 mL/hr over 30 Minutes Intravenous  Once 02/09/21 4627 02/09/21 1044      Current Facility-Administered Medications  Medication Dose Route Frequency Provider Last Rate Last Admin  . 0.9 %  sodium chloride infusion   Intravenous Continuous Sheikh, Kateri Mc Latif, DO      . acetaminophen (TYLENOL) tablet 650 mg  650 mg Oral Q6H PRN Marguerita Merles Latif, DO       Or  . acetaminophen (TYLENOL) suppository 650 mg  650 mg Rectal Q6H PRN  Marguerita Merles Latif, DO      . bisacodyl (DULCOLAX) suppository 10 mg  10 mg Rectal Daily PRN Marland Mcalpine, Omair Latif, DO      . cefTRIAXone (ROCEPHIN) 2 g in sodium chloride 0.9 % 100 mL IVPB  2 g Intravenous Q24H Sheikh, Omair Latif, DO      . fentaNYL (SUBLIMAZE) injection 12.5 mcg  12.5 mcg Intravenous Q2H PRN Sheikh, Kateri Mc Latif, DO      . heparin injection 5,000 Units  5,000 Units Subcutaneous Q8H Sheikh, Omair Latif, DO      . hydrALAZINE (APRESOLINE) injection 10 mg  10 mg Intravenous Q6H PRN Sheikh, Omair Latif, DO      . HYDROcodone-acetaminophen (NORCO/VICODIN) 5-325 MG per tablet 1-2 tablet  1-2 tablet Oral Q4H PRN Sheikh, Omair Latif, DO      . ondansetron [facility]) tablet 4 mg  4 mg Oral Q6H PRN Marguerita Merles Latif, DO       Or  . ondansetron [facility]) injection 4 mg  4 mg Intravenous Q6H PRN Sheikh, Omair Latif, DO      . polyethylene glycol (MIRALAX / GLYCOLAX) packet 17 g  17 g Oral Daily PRN Marland Mcalpine, Omair Latif, DO      . tamsulosin (FLOMAX) capsule 0.4 mg  0.4 mg Oral Daily Sheikh, Omair Latif, DO         Objective: Vital signs in last 24 hours: BP 126/71   Pulse 75   Temp 100 F (37.8 C) (Oral)   Resp 16   Ht 5\' 7"  (1.702 m)   Wt 122.1 kg   LMP 01/12/2021 Comment: Neg HCG  SpO2 98%   BMI 42.16 kg/m   Intake/Output from previous day: No intake/output data recorded. Intake/Output this shift: No intake/output data recorded.   Physical Exam Vitals reviewed.  Constitutional:      Appearance: She is well-developed. She is obese.  Cardiovascular:     Rate and Rhythm: Normal rate and regular rhythm.     Heart sounds: Normal heart sounds.  Pulmonary:     Effort: Pulmonary effort is normal. No respiratory distress.     Breath sounds: Normal breath sounds.  Abdominal:     Palpations: Abdomen is soft.     Tenderness: There is abdominal tenderness in the left upper quadrant.     Comments: Her tenderness is diffuse but worse in the LUQ and flank.   Skin:    General:  Skin is warm.  Neurological:     General: No focal deficit present.     Mental Status: She is alert and oriented to person, place, and time.     Lab Results:  Results for orders placed or performed during the hospital encounter of 02/09/21 (from the past 24 hour(s))  CBC with Differential     Status: Abnormal   Collection Time: 02/09/21  9:33 AM  Result Value Ref Range   WBC 14.7 (H) 4.0 - 10.5 K/uL   RBC 4.33 3.87 - 5.11 MIL/uL   Hemoglobin 13.3 12.0 - 15.0 g/dL   HCT 58.0 99.8 - 33.8 %   MCV 91.2 80.0 - 100.0 fL   MCH 30.7 26.0 - 34.0 pg   MCHC 33.7 30.0 - 36.0 g/dL   RDW 25.0 53.9 - 76.7 %   Platelets 227 150 - 400 K/uL   nRBC 0.0 0.0 - 0.2 %   Neutrophils Relative % 86 %   Neutro Abs 12.5 (H) 1.7 - 7.7 K/uL   Lymphocytes Relative 8 %   Lymphs Abs 1.2 0.7 - 4.0 K/uL   Monocytes Relative 6 %   Monocytes Absolute 0.9 0.1 - 1.0 K/uL   Eosinophils Relative 0 %   Eosinophils Absolute 0.0 0.0 - 0.5 K/uL   Basophils Relative 0 %   Basophils Absolute 0.0 0.0 - 0.1 K/uL   Immature Granulocytes 0 %   Abs Immature Granulocytes 0.06 0.00 - 0.07 K/uL  Comprehensive metabolic panel     Status: Abnormal   Collection Time: 02/09/21  9:33 AM  Result Value Ref Range   Sodium 135 135 - 145 mmol/L   Potassium 3.4 (L) 3.5 - 5.1 mmol/L   Chloride 98 98 - 111 mmol/L   CO2 25 22 - 32 mmol/L   Glucose, Bld 105 (H) 70 - 99 mg/dL   BUN 12 6 - 20 mg/dL   Creatinine, Ser 3.41 (H) 0.44 - 1.00 mg/dL   Calcium 9.0 8.9 - 93.7 mg/dL   Total Protein 8.0 6.5 - 8.1 g/dL   Albumin 3.2 (L) 3.5 - 5.0 g/dL   AST 18 15 - 41 U/L   ALT 17 0 - 44 U/L   Alkaline Phosphatase 141 (H) 38 - 126 U/L   Total Bilirubin 1.0 0.3 - 1.2 mg/dL   GFR, Estimated >90 >24 mL/min   Anion gap 12 5 - 15  Lactic acid, plasma     Status: None   Collection Time: 02/09/21 10:00 AM  Result Value Ref Range   Lactic Acid, Venous 1.0 0.5 - 1.9 mmol/L  hCG, serum, qualitative     Status: None   Collection Time: 02/09/21 12:16 PM   Result Value Ref Range   Preg, Serum NEGATIVE NEGATIVE  Resp Panel by RT-PCR (Flu A&B, Covid) Nasopharyngeal Swab     Status: None   Collection Time: 02/09/21  2:32 PM   Specimen: Nasopharyngeal Swab; Nasopharyngeal(NP) swabs in vial transport medium  Result Value Ref Range   SARS Coronavirus 2 by RT PCR NEGATIVE NEGATIVE   Influenza A by PCR NEGATIVE NEGATIVE   Influenza B by PCR NEGATIVE NEGATIVE    BMET Recent Labs    02/09/21 0933  NA 135  K 3.4*  CL 98  CO2 25  GLUCOSE 105*  BUN 12  CREATININE 1.11*  CALCIUM 9.0   PT/INR No results for input(s): LABPROT, INR in the last 72 hours. ABG No results for input(s): PHART, HCO3 in the last 72 hours.  Invalid input(s): PCO2, PO2  Studies/Results: CT ABDOMEN PELVIS W CONTRAST  Result Date: 02/09/2021 CLINICAL DATA:  LEFT flank and LEFT lower quadrant pain, fever, tenderness, question kidney stone, perinephric abscess EXAM: CT ABDOMEN AND PELVIS WITH CONTRAST TECHNIQUE: Multidetector CT imaging of the abdomen and pelvis was performed using the standard protocol following  bolus administration of intravenous contrast. Sagittal and coronal MPR images reconstructed from axial data set. CONTRAST:  OMNIPAQUE IOHEXOL 300 MG/ML SOLN IV. No oral contrast. COMPARISON:  03/03/2018 FINDINGS: Lower chest: Minimal atelectasis LEFT lung base. Hepatobiliary: Gallbladder and liver normal appearance Pancreas: Normal appearance Spleen: Normal appearance Adrenals/Urinary Tract: Adrenal glands normal appearance. Multiple small BILATERAL nonobstructing renal calculi. Mild enlargement of LEFT kidney with slight delay in LEFT nephrogram. Mild LEFT hydronephrosis and hydroureter extending to urinary bladder. Diffuse wall thickening of the ureteral wall and renal pelvis. No obstructing renal calculus is seen within the LEFT ureter. Significant peripelvic and mild perinephric edema of LEFT kidney. Bladder unremarkable. Stomach/Bowel: Normal appendix. Stomach  and bowel loops normal appearance. Vascular/Lymphatic: Aorta normal caliber. Circumaortic LEFT renal vein. Upper normal sized LEFT para-aortic lymph node 10 mm short axis image 47. Enlarged portal caval node 17 mm image 34. Additional scattered normal sized LEFT para-aortic, aortocaval, and retroperitoneal nodes. Reproductive: Unremarkable uterus and ovaries for age. Other: No free air or free fluid.  No hernia. Musculoskeletal: Unremarkable IMPRESSION: Multiple small BILATERAL nonobstructing renal calculi. Diffuse wall thickening and enhancement of the LEFT ureteral wall and renal pelvis suspicious for urinary tract infection; recommend correlation with urinalysis. LEFT hydronephrosis and hydroureter without visualized obstructing calculus, could represent passed calculus or be related to infection. Periportal and perinephric edema potentially due to obstruction or infection but forniceal rupture not excluded. Enlarged portal caval lymph node 17 mm short axis, nonspecific. Electronically Signed   By: Ulyses Southward M.D.   On: 02/09/2021 13:58     Assessment/Plan: Left hydroureteronephrosis to the bladder with perinephric fluid consistent with a forniceal rupture.  No stone is seen in the urine so the findings could be secondary to a recently passed stone or possibility tissue debris.   I have recommended a foley catheter and renal US and if the hydro persists, she will need cystoscopy with left ureteral stenting.  I will make her NPO and reviewed the risks of the procedure including bleeding, infection, ureteral injury, need for secondary procedures,thrombotic events and anesthetic complications.   UTI with pyelonephritis.  She has a low grade fever with normal VS at this time.  Continue antibiotic therapy.  Bilateral renal stones.  She has small bilateral renal stones that will need f/u ongoing.         No follow-ups on file.    CC: Dr. Merlene Laughter.      Bjorn Pippin 02/09/[id number]

## 2021-02-09 NOTE — Anesthesia Postprocedure Evaluation (Signed)
Anesthesia Post Note  Patient: Mindy Davies  Procedure(s) Performed: CYSTOSCOPY WITH RETROGRADE PYELOGRAM/URETERAL STENT PLACEMENT (Left Urethra)     Patient location during evaluation: PACU Anesthesia Type: General Level of consciousness: awake and alert Pain management: pain level controlled Vital Signs Assessment: post-procedure vital signs reviewed and stable Respiratory status: spontaneous breathing, nonlabored ventilation and respiratory function stable Cardiovascular status: blood pressure returned to baseline and stable Postop Assessment: no apparent nausea or vomiting Anesthetic complications: no   No complications documented.  Last Vitals:  Vitals:   02/09/21 2245 02/09/21 2300  BP: 138/72   Pulse: 83   Resp: (!) 21   Temp:  37.6 C  SpO2: 100%     Last Pain:  Vitals:   02/09/21 2245  TempSrc:   PainSc: Asleep                 Lowella Curb

## 2021-02-09 NOTE — ED Notes (Signed)
Report given to Maralyn Sago, Receiving RN

## 2021-02-09 NOTE — ED Triage Notes (Signed)
Pt via EMS c/o left flank pain and LLQ abd pain. Went to [facility] Friday and was given pain meds and took half on Friday and the other half Saturday. Reports sharp pain that will not go away. Unable to tolerate PO. + N/V since Friday. Felt this pain before but was some years ago. Hx of Kidney Stones but reports this pain feels different

## 2021-02-09 NOTE — Anesthesia Procedure Notes (Signed)
Procedure Name: LMA Insertion Date/Time: 02/09/2021 9:46 PM Performed by: Yolonda Kida, CRNA Pre-anesthesia Checklist: Patient identified, Emergency Drugs available, Suction available and Patient being monitored Patient Re-evaluated:Patient Re-evaluated prior to induction Oxygen Delivery Method: Circle system utilized Preoxygenation: Pre-oxygenation with 100% oxygen Induction Type: IV induction LMA: LMA inserted LMA Size: 4.0 Number of attempts: 1 Placement Confirmation: positive ETCO2 and breath sounds checked- equal and bilateral Tube secured with: Tape Dental Injury: Teeth and Oropharynx as per pre-operative assessment

## 2021-02-10 ENCOUNTER — Encounter (HOSPITAL_COMMUNITY): Payer: Self-pay | Admitting: Urology

## 2021-02-10 ENCOUNTER — Telehealth: Payer: Self-pay | Admitting: Emergency Medicine

## 2021-02-10 DIAGNOSIS — N179 Acute kidney failure, unspecified: Secondary | ICD-10-CM | POA: Diagnosis not present

## 2021-02-10 DIAGNOSIS — N390 Urinary tract infection, site not specified: Secondary | ICD-10-CM | POA: Diagnosis not present

## 2021-02-10 DIAGNOSIS — N10 Acute tubulo-interstitial nephritis: Secondary | ICD-10-CM | POA: Diagnosis not present

## 2021-02-10 DIAGNOSIS — E876 Hypokalemia: Secondary | ICD-10-CM | POA: Diagnosis not present

## 2021-02-10 DIAGNOSIS — N3030 Trigonitis without hematuria: Secondary | ICD-10-CM

## 2021-02-10 LAB — COMPREHENSIVE METABOLIC PANEL
ALT: 16 U/L (ref 0–44)
AST: 16 U/L (ref 15–41)
Albumin: 3.1 g/dL — ABNORMAL LOW (ref 3.5–5.0)
Alkaline Phosphatase: 136 U/L — ABNORMAL HIGH (ref 38–126)
Anion gap: 11 (ref 5–15)
BUN: 13 mg/dL (ref 6–20)
CO2: 22 mmol/L (ref 22–32)
Calcium: 8.7 mg/dL — ABNORMAL LOW (ref 8.9–10.3)
Chloride: 104 mmol/L (ref 98–111)
Creatinine, Ser: 0.87 mg/dL (ref 0.44–1.00)
GFR, Estimated: >60 mL/min (ref >60)
Glucose, Bld: 119 mg/dL — ABNORMAL HIGH (ref 70–99)
Potassium: 4.2 mmol/L (ref 3.5–5.1)
Sodium: 137 mmol/L (ref 135–145)
Total Bilirubin: 0.9 mg/dL (ref 0.3–1.2)
Total Protein: 7.2 g/dL (ref 6.5–8.1)

## 2021-02-10 LAB — CBC WITH DIFFERENTIAL/PLATELET
Abs Immature Granulocytes: 0.05 10*3/uL (ref 0.00–0.07)
Basophils Absolute: 0 10*3/uL (ref 0.0–0.1)
Basophils Relative: 0 %
Eosinophils Absolute: 0 10*3/uL (ref 0.0–0.5)
Eosinophils Relative: 0 %
HCT: 37.7 % (ref 36.0–46.0)
Hemoglobin: 12.1 g/dL (ref 12.0–15.0)
Immature Granulocytes: 0 %
Lymphocytes Relative: 8 %
Lymphs Abs: 0.9 10*3/uL (ref 0.7–4.0)
MCH: 30.6 pg (ref 26.0–34.0)
MCHC: 32.1 g/dL (ref 30.0–36.0)
MCV: 95.4 fL (ref 80.0–100.0)
Monocytes Absolute: 0.4 10*3/uL (ref 0.1–1.0)
Monocytes Relative: 3 %
Neutro Abs: 10 10*3/uL — ABNORMAL HIGH (ref 1.7–7.7)
Neutrophils Relative %: 89 %
Platelets: 226 10*3/uL (ref 150–400)
RBC: 3.95 MIL/uL (ref 3.87–5.11)
RDW: 12.3 % (ref 11.5–15.5)
WBC: 11.3 10*3/uL — ABNORMAL HIGH (ref 4.0–10.5)
nRBC: 0 % (ref 0.0–0.2)

## 2021-02-10 LAB — HEMOGLOBIN A1C
Hgb A1c MFr Bld: 5.5 % (ref 4.8–5.6)
Mean Plasma Glucose: 111.15 mg/dL

## 2021-02-10 LAB — MAGNESIUM: Magnesium: 2 mg/dL (ref 1.7–2.4)

## 2021-02-10 LAB — PHOSPHORUS: Phosphorus: 4.1 mg/dL (ref 2.5–4.6)

## 2021-02-10 LAB — TSH: TSH: 0.44 u[IU]/mL (ref 0.350–4.500)

## 2021-02-10 LAB — HIV ANTIBODY (ROUTINE TESTING W REFLEX): HIV Screen 4th Generation wRfx: NONREACTIVE

## 2021-02-10 MED ORDER — CHLORHEXIDINE GLUCONATE CLOTH 2 % EX PADS
6.0000 | MEDICATED_PAD | Freq: Every day | CUTANEOUS | Status: DC
  Administered 2021-02-10: 6 via TOPICAL

## 2021-02-10 MED ORDER — NITROFURANTOIN MONOHYD MACRO 100 MG PO CAPS: 100.0000 mg | ORAL_CAPSULE | Freq: Every day | ORAL | 3 refills | Status: DC

## 2021-02-10 MED ORDER — ONDANSETRON HCL 4 MG PO TABS: 4.0000 mg | ORAL_TABLET | Freq: Four times a day (QID) | ORAL | 0 refills | Status: DC | PRN

## 2021-02-10 MED ORDER — POLYETHYLENE GLYCOL 3350 17 G PO PACK: 17.0000 g | PACK | Freq: Every day | ORAL | 0 refills | Status: DC | PRN

## 2021-02-10 MED ORDER — NITROFURANTOIN MONOHYD MACRO 100 MG PO CAPS: 100.0000 mg | ORAL_CAPSULE | Freq: Every day | ORAL | Status: DC

## 2021-02-10 MED ORDER — AMOXICILLIN-POT CLAVULANATE 875-125 MG PO TABS: 1.0000 | ORAL_TABLET | Freq: Two times a day (BID) | ORAL | 0 refills | Status: AC

## 2021-02-10 MED ORDER — AMOXICILLIN-POT CLAVULANATE 875-125 MG PO TABS
1.0000 | ORAL_TABLET | Freq: Two times a day (BID) | ORAL | Status: DC
  Administered 2021-02-10: 1 via ORAL
  Filled 2021-02-10: qty 1

## 2021-02-10 NOTE — TOC Transition Note (Signed)
Transition of Care [facility]) - CM/SW Discharge Note   Patient Details  Name: ADANNA ZUCKERMAN MRN: [id number] Date of Birth: Dec 17, 1985  [facility] CM/SW Contact:  Darleene Cleaver, LCSW Phone Number: 02/10/2021, 12:40 PM   Clinical Narrative:     Patient will be going home with a walker.  CSW signing off please reconsult with any other social work needs, [employer] [facility] contact information has been placed in AVS and also contact information for patient's insurance company to find a PCP in network also included in AVS.   Final next level of care: Home/Self Care Barriers to Discharge: Barriers Resolved   Patient Goals and CMS Choice Patient states their goals for this hospitalization and ongoing recovery are:: To return back home. CMS Medicare.gov Compare Post Acute Care list provided to:: Patient Choice offered to / list presented to : Patient  Discharge Placement                       Discharge Plan and Services                DME Arranged: Other see comment DME Agency: Other - Comment Loyal Buba) Date DME Agency Contacted: 02/10/21 Time DME Agency Contacted: [phone] Representative spoke with at DME Agency: Vaughan Basta and Jonny Ruiz            Social Determinants of Health (SDOH) Interventions     Readmission Risk Interventions No flowsheet data found.

## 2021-02-10 NOTE — Discharge Summary (Signed)
Physician Discharge Summary  Mindy Davies ZOX:[id number] DOB: May 22, 1986 DOA: 02/09/2021  PCP: Patient, No Pcp Per  Admit date: 02/09/2021 Discharge date: 02/10/2021  Admitted From: Home Disposition: Home  Recommendations for Outpatient Follow-up:  1. Follow up with PCP in 1-2 weeks 2. Follow up with Urology within 1-2 weeks  3. Please obtain BMP/CBC in one week 4. Please follow up on the following pending results:  Home Health: No Equipment/Devices: Agricultural consultant with 5 Inch Wheels    Discharge Condition: Stable CODE STATUS: FULL CODE  Diet recommendation: Regular Diet  Brief/Interim Summary: Mindy Davies is a 35 y.o. female with medical history significant of PCOS, history of urinary urgency and history of nephrolithiasis who presented to [facility] ED on 02/06/2021 for flank pain and vomiting.  This was felt to be related to kidney stones with low concern for pyelonephritis and urine cultures and urinalysis was sent.  Will treat with IV fluids and pain medication and discharged home without antibiotics for unclear reasons.  On 02/08/2021 urine culture showed greater than 100,000 colony-forming units of gram-negative rods and EDP call a prescription for Keflex with advice for the patient to return to the ED to rule out obstructing kidney stone with concurrent infection.  The final cultures grew out E. coli that is pansensitive only resistant to ampicillin.  She presents again today via EMS with complaints of left flank pain, lower quadrant abdominal pain that was not relieved by pain meds, nausea as well as having some vomiting and unable to tolerate p.o. intake.  In the ED she had a repeat CT of the abdomen and pelvis which showed multiple small bilateral nonobstructing renal calculi and a diffuse thickening and enhancement of the left ureteral wall and renal pelvis suspicious for urinary tract infection and left hydronephrosis and hydroureter without visualized obstructing calculus  that could represent a passed calculus related to infection.  She also was noted to have periportal and perinephric edema potentially due to obstruction or infection but forniceal rupture was not excluded.  Transferred from med center [facility] to [facility] and accepted as an observation status interrogations will admit this patient for acute pyelonephritis.  Upon my evaluation she was extremely somnolent and drowsy after she received several pain medications in the ED.  I spoke to her and she is still a little sleepy but understands that the urologist will come see her.  She denies any chest pain or shortness of breath.  No other concerns or complaints at this time.  ED Course: In the ED she was given 2 doses of IV morphine, a liter of lactated Ringer's, started on IV antibiotics with ceftriaxone and a Covid testing was done and negative. She had basic blood work done in the ED and CT Abd/Pelvis. Will place a Foley Catheter.   **Interim History She was admitted for left hydroureteronephrosis to the bladder with perinephric fluid consistent with forniceal rupture and no stone was seen urine findings could have been secondary to recently passed stone or possibly tissue debris.  Urology was consulted and they recommended Foley catheter and renal ultrasound to see if the hydronephrosis persisted.  Because they did she was taken for cystoscopy with left ureteral stenting and is made n.p.o and taken for this on 02/09/2021.  Her urine culture grew out E. coli that was pansensitive and she is started on IV ceftriaxone.  After her procedure her postop diagnosis showed that she had left hydronephrosis with febrile UTI and ureteral narrowing in  the intramural ureter and proximal they are of uncertain etiology as well as chronic follicular cystitis.  She improved the next day and he has decreased pain and has no fever.  Her WBC and creatinine are trending downward.  I spoke with urology Dr. Chauncy Lean who feels that she  is stable to be discharged given that her Foley catheter is now out.  He recommends treating for at least 10 to 14 days of culture specific antibiotics and then starting the patient on Macrobid 100 mg p.o. twice daily for 3 to 6 months for a course of suppressive antibiotic therapy for her chronic follicular cystitis noted.  She will need to follow-up with Dr. Mena Goes in 1 to 2 weeks and urology following up and arranging it.  He is stable to be discharged at this time and he OT recommending no home health but did recommend a rolling walker  Discharge Diagnoses:  Active Problems:   Acute pyelonephritis   E. coli UTI (urinary tract infection)   AKI (acute kidney injury) (HCC)   Hypokalemia  Acute E Coli Pyleonephritis complicated Left Hydronephrosis and Ureteral obstruction and possible Ureteral Stricture and possible Renal Forniceal Rupture s/p Stenting Chronic Follicular Cystitis  Nephrolithiasis  -Admitted to Obs [facility]  -CT Abd/Pelvis showed "Multiple small BILATERAL nonobstructing renal calculi. Diffuse wall thickening and enhancement of the LEFT ureteral wall and renal pelvis suspicious for urinary tract infection; recommend correlation with urinalysis. LEFT hydronephrosis and hydroureter without visualized obstructing calculus, could represent passed calculus or be related to infection. Periportal and perinephric edema potentially due to obstruction or infection but forniceal rupture not excluded. Enlarged portal caval lymph node 17 mm short axis, nonspecific." -Had a urinalysis on 02/06/2021 which showed a cloudy appearance with moderate hemoglobin, moderate leukocytes, positive nitrites, pH of 9, few bacteria, greater than 50 RBCs per high-power field, 6-10 squamous epithelial cells, 21-50 WBCs, and urine culture showed E. coli greater than 100,000 colony-forming units only resistant to ampicillin -Check Blood Cx x2 -WBC on admission was 14.7 and up from 7.8 approximately 3 days ago and  has now improved to 11.3 -She was NOT septic on admission -EDP Discussed with Urology Dr. Annabell Howells; Will consult and get formal opinion and he recommended Foley Drainage and Repeat Renal U/S  -IVF started and will get NS at 100 mL/hr -Start Abx with IV Ceftriaxone and will change to p.o. Augmentin for total of 10 days and after prep suppressive therapy with Macrobid 100 refill nightly -Continue to Monitor and Trend Cx's and continue IV Abx, IVF, and Follow Urology Recc's  -Having Pain and given IV Morphine 4 mg x2 in the ED and given Antiemetics with IV Zofran 4 mg  -Given 1 Liter of LR and will continue Maintenance IVF with NS at 100 mL/hr and now improved -Because her renal ultrasound still showed hydronephrosis 100 left hydronephrosis proximal hydroureter she was taken for stenting and Foley catheter was placed in the clinic Foley catheter is now removed and she is doing better -Diet has been advanced -Urology has signed off the case and is stable to be discharged and follow-up with Dr. Mena Goes in 1 to 2 weeks  AKI -In the setting of above and improving  -Patient's BUNs/creatinine went from 10/0.78 -> 12/1.11 -> 13/0.87 -IV fluid hydration and will avoid nephrotoxic medications, contrast dyes, hypotension renally dose medications -Given 1 Liter of LR and will continued Maintenance IVF with NS at 100 mL/hr -Repeat CMP within 1 week -Foley catheter was inserted yesterday and now  removed by urology and she is urinating fairly well  Hyperglycemia -Likely Reactive -Check HbA1c as an outpatient   -Continue to Monitor and Trend Blood Sugars and if Needed will place on Sensitive Novolog SSI AC  Hypokalemia -Patient's K+ was 3.4 and improved to 4.2 -Replete with po KCl 40 mEQ BID x2 yesterday -Continue to Monitor and Replete as Necessary; Checked Mag Level  -Repeat CMP in the AM   Morbid Obesity -Complicates overall prognosis and care -Estimated body mass index is 42.16 kg/m as  calculated from the following:   Height as of this encounter:  (1.702 m).   Weight as of this encounter: 122.1 kg. -Weight Loss and Dietary Counseling given    Discharge Instructions  Discharge Instructions    Call MD for:  difficulty breathing, headache or visual disturbances   Complete by: As directed    Call MD for:  extreme fatigue   Complete by: As directed    Call MD for:  hives   Complete by: As directed    Call MD for:  persistant dizziness or light-headedness   Complete by: As directed    Call MD for:  persistant nausea and vomiting   Complete by: As directed    Call MD for:  redness, tenderness, or signs of infection (pain, swelling, redness, odor or green/yellow discharge around incision site)   Complete by: As directed    Call MD for:  severe uncontrolled pain   Complete by: As directed    Call MD for:  temperature >100.4   Complete by: As directed    Diet - low sodium heart healthy   Complete by: As directed    Discharge instructions   Complete by: As directed    You were cared for by a hospitalist during your hospital stay. If you have any questions about your discharge medications or the care you received while you were in the hospital after you are discharged, you can call the unit and ask to speak with the hospitalist on call if the hospitalist that took care of you is not available. Once you are discharged, your primary care physician will handle any further medical issues. Please note that NO REFILLS for any discharge medications will be authorized once you are discharged, as it is imperative that you return to your primary care physician (or establish a relationship with a primary care physician if you do not have one) for your aftercare needs so that they can reassess your need for medications and monitor your lab values.  Follow up with PCP and Urology Dr. Mena Goes in 1-2 weeks. Take all medications as prescribed. If symptoms change or worsen please return to  the ED for evaluation   Increase activity slowly   Complete by: As directed    No wound care   Complete by: As directed      Allergies as of 02/10/2021   No Known Allergies     Medication List    STOP taking these medications   cephALEXin 500 MG capsule Commonly known as: KEFLEX     TAKE these medications   acetaminophen 500 MG tablet Commonly known as: TYLENOL Take 500 mg by mouth every 6 (six) hours as needed for moderate pain.   amoxicillin-clavulanate 875-125 MG tablet Commonly known as: AUGMENTIN Take 1 tablet by mouth every 12 (twelve) hours for 18 doses.   nitrofurantoin (macrocrystal-monohydrate) 100 MG capsule Commonly known as: MACROBID Take 1 capsule (100 mg total) by mouth at bedtime. Start taking  on: February 19, 2021   ondansetron 4 MG tablet Commonly known as: ZOFRAN Take 1 tablet (4 mg total) by mouth every 6 (six) hours as needed for nausea.   oxyCODONE-acetaminophen 5-325 MG tablet Commonly known as: PERCOCET/ROXICET Take 1 tablet by mouth every 6 (six) hours as needed for severe pain.   polyethylene glycol 17 g packet Commonly known as: MIRALAX / GLYCOLAX Take 17 g by mouth daily as needed for mild constipation.   tamsulosin 0.4 MG Caps capsule Commonly known as: FLOMAX Take 1 capsule (0.4 mg total) by mouth daily.            Durable Medical Equipment  (From admission, onward)         Start     Ordered   02/10/21 1035  For home use only DME Walker rolling  Once       Question Answer Comment  Walker: With 5 Inch Wheels   Patient needs a walker to treat with the following condition Generalized weakness      02/10/21 1034          Follow-up Information    [facility].   Why: The office will call to arrange f/u.  Contact information: [address] [place] [address] [phone]             No Known Allergies  Consultations:  Urology  Procedures/Studies: CT ABDOMEN PELVIS W  CONTRAST  Result Date: 02/09/2021 CLINICAL DATA:  LEFT flank and LEFT lower quadrant pain, fever, tenderness, question kidney stone, perinephric abscess EXAM: CT ABDOMEN AND PELVIS WITH CONTRAST TECHNIQUE: Multidetector CT imaging of the abdomen and pelvis was performed using the standard protocol following bolus administration of intravenous contrast. Sagittal and coronal MPR images reconstructed from axial data set. CONTRAST:  OMNIPAQUE IOHEXOL 300 MG/ML SOLN IV. No oral contrast. COMPARISON:  03/03/2018 FINDINGS: Lower chest: Minimal atelectasis LEFT lung base. Hepatobiliary: Gallbladder and liver normal appearance Pancreas: Normal appearance Spleen: Normal appearance Adrenals/Urinary Tract: Adrenal glands normal appearance. Multiple small BILATERAL nonobstructing renal calculi. Mild enlargement of LEFT kidney with slight delay in LEFT nephrogram. Mild LEFT hydronephrosis and hydroureter extending to urinary bladder. Diffuse wall thickening of the ureteral wall and renal pelvis. No obstructing renal calculus is seen within the LEFT ureter. Significant peripelvic and mild perinephric edema of LEFT kidney. Bladder unremarkable. Stomach/Bowel: Normal appendix. Stomach and bowel loops normal appearance. Vascular/Lymphatic: Aorta normal caliber. Circumaortic LEFT renal vein. Upper normal sized LEFT para-aortic lymph node 10 mm short axis image 47. Enlarged portal caval node 17 mm image 34. Additional scattered normal sized LEFT para-aortic, aortocaval, and retroperitoneal nodes. Reproductive: Unremarkable uterus and ovaries for age. Other: No free air or free fluid.  No hernia. Musculoskeletal: Unremarkable IMPRESSION: Multiple small BILATERAL nonobstructing renal calculi. Diffuse wall thickening and enhancement of the LEFT ureteral wall and renal pelvis suspicious for urinary tract infection; recommend correlation with urinalysis. LEFT hydronephrosis and hydroureter without visualized obstructing calculus,  could represent passed calculus or be related to infection. Periportal and perinephric edema potentially due to obstruction or infection but forniceal rupture not excluded. Enlarged portal caval lymph node 17 mm short axis, nonspecific. Electronically Signed   By: Ulyses Southward M.D.   On: 02/09/2021 13:58   US RENAL  Result Date: 02/09/2021 CLINICAL DATA:  Acute kidney injury EXAM: RENAL / URINARY TRACT ULTRASOUND COMPLETE COMPARISON:  04/26/2018, CT 02/09/2021 FINDINGS: Right Kidney: Renal measurements: 13.7 x 5.1 x 5.5 cm = volume: 199.9 mL. Cortical  echogenicity is within normal limits. No mass or hydronephrosis. Shadowing stone at the lower pole measuring 8 mm. Left Kidney: Renal measurements: 13.9 x 7.3 x 8.5 cm = volume: 451.6 mL. Echogenicity within normal limits. Mild to moderate left hydronephrosis and proximal hydroureter. 4 mm shadowing stone within the upper pole. Bladder: Foley catheter within the bladder Other: None. IMPRESSION: 1. Similar mild to moderate left hydronephrosis and proximal hydroureter. No right hydronephrosis. 2. Bilateral kidney stones Electronically Signed   By: Jasmine Pang M.D.   On: 02/09/2021 19:31   DG C-Arm 1-60 Min-No Report  Result Date: 02/09/2021 Fluoroscopy was utilized by the requesting physician.  No radiographic interpretation.    Subjective: Seen and examined at bedside and she was sitting in a chair and complaining of some mild pain whenever she ambulated throughout but no pain at rest.  No nausea or vomiting.  Feels better.  Denies any lightheadedness or dizziness.  No other concerns or complaints at this time.  Discharge Exam: Vitals:   02/10/21 0107 02/10/21 0500  BP: 111/73 115/75  Pulse: (!) 58 (!) 54  Resp: (!) 21 20  Temp: 98.7 F (37.1 C) 97.7 F (36.5 C)  SpO2: 98% 98%   Vitals:   02/09/21 2300 02/09/21 2315 02/10/21 0107 02/10/21 0500  BP: 131/70 134/75 111/73 115/75  Pulse: 80 72 (!) 58 (!) 54  Resp: (!) 22 (!) 21 (!) 21 20  Temp:  99.7 F (37.6 C) 99 F (37.2 C) 98.7 F (37.1 C) 97.7 F (36.5 C)  TempSrc:   Oral Oral  SpO2: 99% 100% 98% 98%  Weight:      Height:       General: Pt is alert, awake, not in acute distress Cardiovascular: RRR, S1/S2 +, no rubs, no gallops Respiratory: CTA bilaterally, no wheezing, no rhonchi; unlabored breathing Abdominal: Soft, mildly tender, distended secondary to body habitus, bowel sounds + Extremities: No appreciable extremity edema, no cyanosis  The results of significant diagnostics from this hospitalization (including imaging, microbiology, ancillary and laboratory) are listed below for reference.    Microbiology: Recent Results (from the past 240 hour(s))  Urine Culture     Status: Abnormal   Collection Time: 02/06/21 12:43 PM   Specimen: Urine, Random  Result Value Ref Range Status   Specimen Description   Final    URINE, RANDOM Performed at [facility], [address], [place], [place] [address]    Special Requests   Final    NONE Performed at [facility], [address] [place], [place] [address]    Culture >=100,000 COLONIES/mL ESCHERICHIA COLI (A)  Final   Report Status 02/09/2021 FINAL  Final   Organism ID, Bacteria ESCHERICHIA COLI (A)  Final      Susceptibility   Escherichia coli - MIC*    AMPICILLIN >=32 RESISTANT Resistant     CEFAZOLIN <=4 SENSITIVE Sensitive     CEFEPIME <=0.12 SENSITIVE Sensitive     CEFTRIAXONE <=0.25 SENSITIVE Sensitive     CIPROFLOXACIN <=0.25 SENSITIVE Sensitive     GENTAMICIN <=1 SENSITIVE Sensitive     IMIPENEM <=0.25 SENSITIVE Sensitive     NITROFURANTOIN <=16 SENSITIVE Sensitive     TRIMETH/SULFA <=20 SENSITIVE Sensitive     AMPICILLIN/SULBACTAM 8 SENSITIVE Sensitive     PIP/TAZO <=4 SENSITIVE Sensitive     * >=100,000 COLONIES/mL ESCHERICHIA COLI  Resp Panel by RT-PCR (Flu A&B, Covid) Nasopharyngeal Swab     Status: None   Collection Time: 02/09/21  2:32 PM   Specimen:  Nasopharyngeal Swab; Nasopharyngeal(NP) swabs in vial transport medium  Result Value Ref Range Status   SARS Coronavirus 2 by RT PCR NEGATIVE NEGATIVE Final    Comment: (NOTE) SARS-CoV-2 target nucleic acids are NOT DETECTED.  The SARS-CoV-2 RNA is generally detectable in upper respiratory specimens during the acute phase of infection. The lowest concentration of SARS-CoV-2 viral copies this assay can detect is 138 copies/mL. A negative result does not preclude SARS-Cov-2 infection and should not be used as the sole basis for treatment or other patient management decisions. A negative result may occur with  improper specimen collection/handling, submission of specimen other than nasopharyngeal swab, presence of viral mutation(s) within the areas targeted by this assay, and inadequate number of viral copies(<138 copies/mL). A negative result must be combined with clinical observations, patient history, and epidemiological information. The expected result is Negative.  Fact Sheet for Patients:  BloggerCourse.com  Fact Sheet for Healthcare Providers:  SeriousBroker.it  This test is no t yet approved or cleared by the [place] FDA and  has been authorized for detection and/or diagnosis of SARS-CoV-2 by FDA under an Emergency Use Authorization (EUA). This EUA will remain  in effect (meaning this test can be used) for the duration of the COVID-19 declaration under Section 564(b)(1) of the Act, 21 U.S.C.section 360bbb-3(b)(1), unless the authorization is terminated  or revoked sooner.       Influenza A by PCR NEGATIVE NEGATIVE Final   Influenza B by PCR NEGATIVE NEGATIVE Final    Comment: (NOTE) The Xpert Xpress SARS-CoV-2/FLU/RSV plus assay is intended as an aid in the diagnosis of influenza from Nasopharyngeal swab specimens and should not be used as a sole basis for treatment. Nasal washings and aspirates are unacceptable for  Xpert Xpress SARS-CoV-2/FLU/RSV testing.  Fact Sheet for Patients: BloggerCourse.com  Fact Sheet for Healthcare Providers: SeriousBroker.it  This test is not yet approved or cleared by the [place] FDA and has been authorized for detection and/or diagnosis of SARS-CoV-2 by FDA under an Emergency Use Authorization (EUA). This EUA will remain in effect (meaning this test can be used) for the duration of the COVID-19 declaration under Section 564(b)(1) of the Act, 21 U.S.C. section 360bbb-3(b)(1), unless the authorization is terminated or revoked.  Performed at [facility], [address] [place], [place] [address]     Labs: BNP (last 3 results) No results for input(s): BNP in the last 8760 hours. Basic Metabolic Panel: Recent Labs  Lab 02/06/21 0643 02/09/21 0933 02/10/21 0349  NA 139 135 137  K 3.8 3.4* 4.2  CL 105 98 104  CO2 23 25 22   GLUCOSE 99 105* 119*  BUN 10 12 13   CREATININE 0.78 1.11* 0.87  CALCIUM 8.3* 9.0 8.7*  MG  --   --  2.0  PHOS  --   --  4.1   Liver Function Tests: Recent Labs  Lab 02/09/21 0933 02/10/21 0349  AST 18 16  ALT 17 16  ALKPHOS 141* 136*  BILITOT 1.0 0.9  PROT 8.0 7.2  ALBUMIN 3.2* 3.1*   No results for input(s): LIPASE, AMYLASE in the last 168 hours. No results for input(s): AMMONIA in the last 168 hours. CBC: Recent Labs  Lab 02/06/21 0643 02/09/21 0933 02/10/21 0349  WBC 7.8 14.7* 11.3*  NEUTROABS 5.6 12.5* 10.0*  HGB 14.1 13.3 12.1  HCT 43.7 39.5 37.7  MCV 94.4 91.2 95.4  PLT 302 227 226   Cardiac Enzymes:  No results for input(s): CKTOTAL, CKMB, CKMBINDEX, TROPONINI in the last 168 hours. BNP: Invalid input(s): POCBNP CBG: No results for input(s): GLUCAP in the last 168 hours. D-Dimer No results for input(s): DDIMER in the last 72 hours. Hgb A1c Recent Labs    02/10/21 0349  HGBA1C 5.5   Lipid Profile No results for input(s): CHOL, HDL,  LDLCALC, TRIG, CHOLHDL, LDLDIRECT in the last 72 hours. Thyroid function studies Recent Labs    02/10/21 0349  TSH 0.440   Anemia work up No results for input(s): VITAMINB12, FOLATE, FERRITIN, TIBC, IRON, RETICCTPCT in the last 72 hours. Urinalysis    Component Value Date/Time   COLORURINE YELLOW 02/06/2021 1243   APPEARANCEUR CLOUDY (A) 02/06/2021 1243   LABSPEC 1.016 02/06/2021 1243   PHURINE 9.0 (H) 02/06/2021 1243   GLUCOSEU NEGATIVE 02/06/2021 1243   HGBUR MODERATE (A) 02/06/2021 1243   BILIRUBINUR NEGATIVE 02/06/2021 1243   KETONESUR NEGATIVE 02/06/2021 1243   PROTEINUR NEGATIVE 02/06/2021 1243   UROBILINOGEN 0.2 02/22/2015 1116   NITRITE POSITIVE (A) 02/06/2021 1243   LEUKOCYTESUR MODERATE (A) 02/06/2021 1243   Sepsis Labs Invalid input(s): PROCALCITONIN,  WBC,  LACTICIDVEN Microbiology Recent Results (from the past 240 hour(s))  Urine Culture     Status: Abnormal   Collection Time: 02/06/21 12:43 PM   Specimen: Urine, Random  Result Value Ref Range Status   Specimen Description   Final    URINE, RANDOM Performed at [facility], [address] [place], [place] [id number]    Special Requests   Final    NONE Performed at [facility], [address] [place], [place] [id number]    Culture >=100,000 COLONIES/mL ESCHERICHIA COLI (A)  Final   Report Status 02/09/2021 FINAL  Final   Organism ID, Bacteria ESCHERICHIA COLI (A)  Final      Susceptibility   Escherichia coli - MIC*    AMPICILLIN >=32 RESISTANT Resistant     CEFAZOLIN <=4 SENSITIVE Sensitive     CEFEPIME <=0.12 SENSITIVE Sensitive     CEFTRIAXONE <=0.25 SENSITIVE Sensitive     CIPROFLOXACIN <=0.25 SENSITIVE Sensitive     GENTAMICIN <=1 SENSITIVE Sensitive     IMIPENEM <=0.25 SENSITIVE Sensitive     NITROFURANTOIN <=16 SENSITIVE Sensitive     TRIMETH/SULFA <=20 SENSITIVE Sensitive     AMPICILLIN/SULBACTAM 8 SENSITIVE Sensitive     PIP/TAZO <=4 SENSITIVE Sensitive      * >=100,000 COLONIES/mL ESCHERICHIA COLI  Resp Panel by RT-PCR (Flu A&B, Covid) Nasopharyngeal Swab     Status: None   Collection Time: 02/09/21  2:32 PM   Specimen: Nasopharyngeal Swab; Nasopharyngeal(NP) swabs in vial transport medium  Result Value Ref Range Status   SARS Coronavirus 2 by RT PCR NEGATIVE NEGATIVE Final    Comment: (NOTE) SARS-CoV-2 target nucleic acids are NOT DETECTED.  The SARS-CoV-2 RNA is generally detectable in upper respiratory specimens during the acute phase of infection. The lowest concentration of SARS-CoV-2 viral copies this assay can detect is 138 copies/mL. A negative result does not preclude SARS-Cov-2 infection and should not be used as the sole basis for treatment or other patient management decisions. A negative result may occur with  improper specimen collection/handling, submission of specimen other than nasopharyngeal swab, presence of viral mutation(s) within the areas targeted by this assay, and inadequate number of viral copies(<138 copies/mL). A negative result must be combined with clinical observations, patient history, and epidemiological information. The expected result is Negative.  Fact Sheet for Patients:  BloggerCourse.com  Fact Sheet for Healthcare Providers:  SeriousBroker.it  This test is no t yet approved or cleared by the [place] FDA and  has been authorized for detection and/or diagnosis of SARS-CoV-2 by FDA under an Emergency Use Authorization (EUA). This EUA will remain  in effect (meaning this test can be used) for the duration of the COVID-19 declaration under Section 564(b)(1) of the Act, 21 U.S.C.section 360bbb-3(b)(1), unless the authorization is terminated  or revoked sooner.       Influenza A by PCR NEGATIVE NEGATIVE Final   Influenza B by PCR NEGATIVE NEGATIVE Final    Comment: (NOTE) The Xpert Xpress SARS-CoV-2/FLU/RSV plus assay is intended as an  aid in the diagnosis of influenza from Nasopharyngeal swab specimens and should not be used as a sole basis for treatment. Nasal washings and aspirates are unacceptable for Xpert Xpress SARS-CoV-2/FLU/RSV testing.  Fact Sheet for Patients: BloggerCourse.com  Fact Sheet for Healthcare Providers: SeriousBroker.it  This test is not yet approved or cleared by the [place] FDA and has been authorized for detection and/or diagnosis of SARS-CoV-2 by FDA under an Emergency Use Authorization (EUA). This EUA will remain in effect (meaning this test can be used) for the duration of the COVID-19 declaration under Section 564(b)(1) of the Act, 21 U.S.C. section 360bbb-3(b)(1), unless the authorization is terminated or revoked.  Performed at [facility], [address] [place], [place] [address]    Time coordinating discharge: 35 minutes  SIGNED:  Merlene Laughter, DO [facility] 02/10/2021, 10:58 AM Pager is on AMION  If 7PM-7AM, please contact night-coverage [email or web address]

## 2021-02-10 NOTE — Progress Notes (Signed)
1 Day Post-Op  Subjective: Mindy Davies is feeling better with decreased pain.  She has no fever.  Her Cr and WBC are falling.   ROS:  Review of Systems  All other systems reviewed and are negative.   Anti-infectives: Anti-infectives (From admission, onward)   Start     Dose/Rate Route Frequency Ordered Stop   02/09/21 2200  cefTRIAXone (ROCEPHIN) 2 g in sodium chloride 0.9 % 100 mL IVPB        2 g 200 mL/hr over 30 Minutes Intravenous Every 24 hours 02/09/21 1817     02/09/21 0945  cefTRIAXone (ROCEPHIN) 1 g in sodium chloride 0.9 % 100 mL IVPB        1 g 200 mL/hr over 30 Minutes Intravenous  Once 02/09/21 3329 02/09/21 1044      Current Facility-Administered Medications  Medication Dose Route Frequency Provider Last Rate Last Admin  . 0.9 %  sodium chloride infusion   Intravenous Continuous Bjorn Pippin, MD 100 mL/hr at 02/10/21 0511 New Bag at 02/10/21 0511  . acetaminophen (TYLENOL) tablet 650 mg  650 mg Oral Q6H PRN Bjorn Pippin, MD       Or  . acetaminophen (TYLENOL) suppository 650 mg  650 mg Rectal Q6H PRN Bjorn Pippin, MD      . bisacodyl (DULCOLAX) suppository 10 mg  10 mg Rectal Daily PRN Bjorn Pippin, MD      . cefTRIAXone (ROCEPHIN) 2 g in sodium chloride 0.9 % 100 mL IVPB  2 g Intravenous Q24H Bjorn Pippin, MD      . Chlorhexidine Gluconate Cloth 2 % PADS 6 each  6 each Topical Daily Sheikh, Omair Latif, DO      . fentaNYL (SUBLIMAZE) injection 12.5 mcg  12.5 mcg Intravenous Q2H PRN Bjorn Pippin, MD      . heparin injection 5,000 Units  5,000 Units Subcutaneous Q8H Bjorn Pippin, MD   5,000 Units at 02/10/21 0507  . hydrALAZINE (APRESOLINE) injection 10 mg  10 mg Intravenous Q6H PRN Bjorn Pippin, MD      . HYDROcodone-acetaminophen (NORCO/VICODIN) 5-325 MG per tablet 1-2 tablet  1-2 tablet Oral Q4H PRN Bjorn Pippin, MD      . ondansetron [facility]) tablet 4 mg  4 mg Oral Q6H PRN Bjorn Pippin, MD       Or  . ondansetron [facility]) injection 4 mg  4 mg Intravenous Q6H PRN Bjorn Pippin, MD       . polyethylene glycol (MIRALAX / GLYCOLAX) packet 17 g  17 g Oral Daily PRN Bjorn Pippin, MD      . tamsulosin (FLOMAX) capsule 0.4 mg  0.4 mg Oral Daily Bjorn Pippin, MD         Objective: Vital signs in last 24 hours: Temp:  [97.7 F (36.5 C)-100.3 F (37.9 C)] 97.7 F (36.5 C) (03/15 0500) Pulse Rate:  [54-86] 54 (03/15 0500) Resp:  [15-23] 20 (03/15 0500) BP: (111-138)/(69-79) 115/75 (03/15 0500) SpO2:  [97 %-100 %] 98 % (03/15 0500) Weight:  [122.1 kg-124.7 kg] 122.1 kg (03/14 1711)  Intake/Output from previous day: 03/14 0701 - 03/15 0700 In: 2604 [I.V.:1400; IV Piggyback:1204] Out: 450 [Urine:450] Intake/Output this shift: No intake/output data recorded.   Physical Exam Vitals reviewed.  Constitutional:      Appearance: She is well-developed. She is obese.  Neurological:     Mental Status: She is alert.     Lab Results:  Recent Labs    02/09/21 0933 02/10/21 0349  WBC 14.7* 11.3*  HGB  13.3 12.1  HCT 39.5 37.7  PLT 227 226   BMET Recent Labs    02/09/21 0933 02/10/21 0349  NA 135 137  K 3.4* 4.2  CL 98 104  CO2 25 22  GLUCOSE 105* 119*  BUN 12 13  CREATININE 1.11* 0.87  CALCIUM 9.0 8.7*   PT/INR No results for input(s): LABPROT, INR in the last 72 hours. ABG No results for input(s): PHART, HCO3 in the last 72 hours.  Invalid input(s): PCO2, PO2  Studies/Results: CT ABDOMEN PELVIS W CONTRAST  Result Date: 02/09/2021 CLINICAL DATA:  LEFT flank and LEFT lower quadrant pain, fever, tenderness, question kidney stone, perinephric abscess EXAM: CT ABDOMEN AND PELVIS WITH CONTRAST TECHNIQUE: Multidetector CT imaging of the abdomen and pelvis was performed using the standard protocol following bolus administration of intravenous contrast. Sagittal and coronal MPR images reconstructed from axial data set. CONTRAST:  OMNIPAQUE IOHEXOL 300 MG/ML SOLN IV. No oral contrast. COMPARISON:  03/03/2018 FINDINGS: Lower chest: Minimal atelectasis LEFT  lung base. Hepatobiliary: Gallbladder and liver normal appearance Pancreas: Normal appearance Spleen: Normal appearance Adrenals/Urinary Tract: Adrenal glands normal appearance. Multiple small BILATERAL nonobstructing renal calculi. Mild enlargement of LEFT kidney with slight delay in LEFT nephrogram. Mild LEFT hydronephrosis and hydroureter extending to urinary bladder. Diffuse wall thickening of the ureteral wall and renal pelvis. No obstructing renal calculus is seen within the LEFT ureter. Significant peripelvic and mild perinephric edema of LEFT kidney. Bladder unremarkable. Stomach/Bowel: Normal appendix. Stomach and bowel loops normal appearance. Vascular/Lymphatic: Aorta normal caliber. Circumaortic LEFT renal vein. Upper normal sized LEFT para-aortic lymph node 10 mm short axis image 47. Enlarged portal caval node 17 mm image 34. Additional scattered normal sized LEFT para-aortic, aortocaval, and retroperitoneal nodes. Reproductive: Unremarkable uterus and ovaries for age. Other: No free air or free fluid.  No hernia. Musculoskeletal: Unremarkable IMPRESSION: Multiple small BILATERAL nonobstructing renal calculi. Diffuse wall thickening and enhancement of the LEFT ureteral wall and renal pelvis suspicious for urinary tract infection; recommend correlation with urinalysis. LEFT hydronephrosis and hydroureter without visualized obstructing calculus, could represent passed calculus or be related to infection. Periportal and perinephric edema potentially due to obstruction or infection but forniceal rupture not excluded. Enlarged portal caval lymph node 17 mm short axis, nonspecific. Electronically Signed   By: Ulyses Southward M.D.   On: 02/09/2021 13:58   US RENAL  Result Date: 02/09/2021 CLINICAL DATA:  Acute kidney injury EXAM: RENAL / URINARY TRACT ULTRASOUND COMPLETE COMPARISON:  04/26/2018, CT 02/09/2021 FINDINGS: Right Kidney: Renal measurements: 13.7 x 5.1 x 5.5 cm = volume: 199.9 mL. Cortical  echogenicity is within normal limits. No mass or hydronephrosis. Shadowing stone at the lower pole measuring 8 mm. Left Kidney: Renal measurements: 13.9 x 7.3 x 8.5 cm = volume: 451.6 mL. Echogenicity within normal limits. Mild to moderate left hydronephrosis and proximal hydroureter. 4 mm shadowing stone within the upper pole. Bladder: Foley catheter within the bladder Other: None. IMPRESSION: 1. Similar mild to moderate left hydronephrosis and proximal hydroureter. No right hydronephrosis. 2. Bilateral kidney stones Electronically Signed   By: Jasmine Pang M.D.   On: 02/09/2021 19:31   DG C-Arm 1-60 Min-No Report  Result Date: 02/09/2021 Fluoroscopy was utilized by the requesting physician.  No radiographic interpretation.     Assessment and Plan: Left ureteral obstruction with pyonephrosis and possible ureteral stricture.  She is improving with the stent.   Foley ordered out.   Dr. Mena Goes will follow.  Chronic follicular cystitis.  She will  potentially need a 3-6 month course of suppressive antibiotic therapy.       LOS: 0 days    Bjorn Pippin 02/10/2021 [phone] ID: [place], female   DOB: [birth year], 35 y.o.   MRN: [id number]

## 2021-02-10 NOTE — Telephone Encounter (Signed)
Post ED Visit - Positive Culture Follow-up  Culture report reviewed by antimicrobial stewardship pharmacist: [facility] []  , Pharm.D. []  Enzo Bi, Pharm.D., BCPS AQ-ID []  , Pharm.D., BCPS []  Celedonio Miyamoto, Pharm.D., BCPS []  [place], Garvin Fila.D., BCPS, AAHIVP []  , Pharm.D., BCPS, AAHIVP []  Georgina Pillion, PharmD, BCPS []  , PharmD, BCPS []  [place], PharmD, BCPS []  [address], PharmD []  , PharmD, BCPS []  Estella Husk, PharmD  Pharmacy Team []  Lysle Pearl, PharmD []  , PharmD []  Phillips Climes, PharmD []  , Rph []  Agapito Games) , PharmD []  Verlan Friends, PharmD []  , PharmD []  Mervyn Gay, PharmD []  , PharmD []  Vinnie Level, PharmD []  [facility], PharmD []  , PharmD []  Len Childs, PharmD   Positive urine culture Treated with keflex 500mg  po tid , organism sensitive to the same and no further patient follow-up is required at this time.  02/10/2021, 10:24 AM

## 2021-02-10 NOTE — Evaluation (Signed)
Occupational Therapy Evaluation Patient Details Name: Mindy Davies MRN: [id number] DOB: November 14, 1986 Today's Date: 02/10/2021    History of Present Illness 35 yo admitted  02/09/2021 for Left hydronephrosis with febrile UTI and ureteral narrowing in the intramural ureter and proximal ureter of uncertain etiology and  Chronic follicular cystitis. S/P cyystoscopy with left retrograde pyelogram and interpretation and   Insertion of left double-J stent on 02/09/21.   Clinical Impression   PTA patient was living in a 3rd floor apartment and was independent with ADLs/IADLs including caring for her 60 y.o. daughter. Patient currently functioning near baseline demonstrating observe ADLs, ADL transfers, and functional mobility with Mod I and increased time 2/2 fear of pain onset. Patient reporting no more than 1/10 pain in "urinary tract" throughout session. Patient education on energy conservation and pain management including sitting for LB dressing, sponge bathing upon initial return home, and having family/friends assist with IADLs. Patient expressed verbal understanding. Patient does not require continued acute occupational therapy services with OT to sign off at this time.     Follow Up Recommendations  No OT follow up    Equipment Recommendations  None recommended by OT    Recommendations for Other Services       Precautions / Restrictions Precautions Precautions: None Restrictions Weight Bearing Restrictions: No      Mobility Bed Mobility               General bed mobility comments: Patient seated in recliner upon entry.    Transfers Overall transfer level: Needs assistance Equipment used: None Transfers: Sit to/from Stand Sit to Stand: Modified independent (Device/Increase time)         General transfer comment: More than increased time 2/2 fear of pain with movement.    Balance Overall balance assessment: No apparent balance deficits (not formally assessed)                                          ADL either performed or assessed with clinical judgement   ADL Overall ADL's : Modified independent                                       General ADL Comments: Increased time 2/2 fear of pain. Reports 1/10 pain at most during observed ADLs this date.     Vision         Perception     Praxis      Pertinent Vitals/Pain Pain Assessment: 0-10 Pain Score: 1  Pain Descriptors / Indicators: Discomfort Pain Intervention(s): Monitored during session     Hand Dominance Right   Extremity/Trunk Assessment Upper Extremity Assessment Upper Extremity Assessment: Overall WFL for tasks assessed   Lower Extremity Assessment Lower Extremity Assessment: Overall WFL for tasks assessed   Cervical / Trunk Assessment Cervical / Trunk Assessment: Normal   Communication Communication Communication: No difficulties   Cognition Arousal/Alertness: Awake/alert Behavior During Therapy: WFL for tasks assessed/performed Overall Cognitive Status: Within Functional Limits for tasks assessed                                     General Comments       Exercises     Shoulder Instructions  Home Living Family/patient expects to be discharged to:: Private residence Living Arrangements: Children (5 YO.) Available Help at Discharge: Friend(s) Type of Home: Apartment Home Access: Stairs to enter [employer] of Steps: 3 flights Entrance Stairs-Rails: Right;Left Home Layout: One level     Bathroom Shower/Tub: Chief Strategy Officer: Standard     Home Equipment: Crutches          Prior Functioning/Environment Level of Independence: Independent        Comments: Working from home. Has 5y.o. child in kindergarten.        OT Problem List: Pain      OT Treatment/Interventions:      OT Goals(Current goals can be found in the care plan section) Acute Rehab OT  Goals Patient Stated Goal: To return home. OT Goal Formulation: With patient  OT Frequency:     Barriers to D/C:            Co-evaluation              AM-PAC OT "6 Clicks" Daily Activity     Outcome Measure Help from another person eating meals?: None Help from another person taking care of personal grooming?: None Help from another person toileting, which includes using toliet, bedpan, or urinal?: None Help from another person bathing (including washing, rinsing, drying)?: A Little Help from another person to put on and taking off regular upper body clothing?: None Help from another person to put on and taking off regular lower body clothing?: None 6 Click Score: 23   End of Session    Activity Tolerance: Patient tolerated treatment well Patient left: in chair;with call bell/phone within reach  OT Visit Diagnosis: Pain Pain - part of body:  ("urinary tract")                Time: 6073-7106 OT Time Calculation (min): 32 min Charges:  OT General Charges $OT Visit: 1 Visit OT Evaluation $OT Eval Low Complexity: 1 Low  Avel Ogawa H. OTR/L Supplemental OT, Department of rehab services [phone]  Irlanda Croghan R H. 02/10/2021, 2:28 PM

## 2021-02-10 NOTE — Evaluation (Signed)
Physical Therapy Evaluation Patient Details Name: Mindy Davies MRN: [id number] DOB: March 24, 1986 Today's Date: 02/10/2021   History of Present Illness  35 yo admitted  02/09/2021 for Left hydronephrosis with febrile UTI and ureteral narrowing in the intramural ureter and proximal ureter of uncertain etiology and  Chronic follicular cystitis. S/P cyystoscopy with left retrograde pyelogram and interpretation and   Insertion of left double-J stent on 02/09/21.  Clinical Impression  The patient moves with caution and guarded. Patient did ambulate in room using RW. Patient should progress to ambulate without device.PATIENT DOES LIVE IN 3RD FLOOR APARTMENT WITH HER 35 YO. Encouraged patient to take every opportunity to ambulate with staff as standby. Pt admitted with above diagnosis.  Pt currently with functional limitations due to the deficits listed below (see PT Problem List). Pt will benefit from skilled PT to increase their independence and safety with mobility to allow discharge to the venue listed below.         Follow Up Recommendations  none    Equipment Recommendations  Rolling walker with 5" wheels (most likely wil not need)    Recommendations for Other Services   OT   Precautions / Restrictions Precautions Precautions: Fall      Mobility  Bed Mobility Overal bed mobility: Modified Independent             General bed mobility comments: extra time, guarding.    Transfers Overall transfer level: Needs assistance Equipment used: Rolling walker (2 wheeled) Transfers: Sit to/from Stand Sit to Stand: Supervision         General transfer comment: slow to rise  Ambulation/Gait Ambulation/Gait assistance: Min guard Gait Distance (Feet): 20 Feet Assistive device: Rolling walker (2 wheeled) Gait Pattern/deviations: Step-through pattern Gait velocity: decr   General Gait Details: Slow to initiate ambulation< " I don't want that pain to return.". moves  slowly  Information systems manager [place] (Stroke Patients Only)       Balance Overall balance assessment: No apparent balance deficits (not formally assessed)                                           Pertinent Vitals/Pain Pain Assessment: 0-10 Pain Score: 2  Pain Location: left side  lower quad Pain Descriptors / Indicators: Discomfort Pain Intervention(s): Monitored during session    Home Living Family/patient expects to be discharged to:: Private residence Living Arrangements: Children (5 YO.) Available Help at Discharge: Friend(s) Type of Home: Apartment Home Access: Stairs to enter Entrance Stairs-Rails: Doctor, general practice of Steps: 3 flights Home Layout: One level Home Equipment: None      Prior Function Level of Independence: Independent               Hand Dominance        Extremity/Trunk Assessment   Upper Extremity Assessment Upper Extremity Assessment: Overall WFL for tasks assessed    Lower Extremity Assessment Lower Extremity Assessment: Overall WFL for tasks assessed    Cervical / Trunk Assessment Cervical / Trunk Assessment: Normal  Communication   Communication: No difficulties  Cognition Arousal/Alertness: Awake/alert Behavior During Therapy: WFL for tasks assessed/performed Overall Cognitive Status: Within Functional Limits for tasks assessed  General Comments      Exercises     Assessment/Plan    PT Assessment Patient needs continued PT services  PT Problem List Decreased activity tolerance;Pain;Decreased mobility       PT Treatment Interventions Gait training;Therapeutic activities;Patient/family education;Stair training;Functional mobility training    PT Goals (Current goals can be found in the Care Plan section)  Acute Rehab PT Goals Patient Stated Goal: go home, see my daughter PT Goal  Formulation: With patient Time For Goal Achievement: 02/24/21 Potential to Achieve Goals: Good    Frequency Min 3X/week   Barriers to discharge        Co-evaluation               AM-PAC PT "6 Clicks" Mobility  Outcome Measure Help needed turning from your back to your side while in a flat bed without using bedrails?: A Little Help needed moving from lying on your back to sitting on the side of a flat bed without using bedrails?: A Little Help needed moving to and from a bed to a chair (including a wheelchair)?: A Little Help needed standing up from a chair using your arms (e.g., wheelchair or bedside chair)?: A Little Help needed to walk in hospital room?: A Little Help needed climbing 3-5 steps with a railing? : A Lot 6 Click Score: 17    End of Session   Activity Tolerance: Patient tolerated treatment well Patient left: in chair;with call bell/phone within reach Nurse Communication: Mobility status PT Visit Diagnosis: Difficulty in walking, not elsewhere classified (R26.2);Pain    Time: 0830-0903 PT Time Calculation (min) (ACUTE ONLY): 33 min   Charges:   PT Evaluation $PT Eval Low Complexity: 1 Low PT Treatments $Gait Training: 8-22 mins        Blanchard Kelch PT Acute Rehabilitation Services Pager [phone] Office [phone]   Rada Hay 02/10/2021, 9:32 AM

## 2021-02-10 NOTE — Plan of Care (Signed)
  Problem: Education: Goal: Knowledge of General Education information will improve Description: Including pain rating scale, medication(s)/side effects and non-pharmacologic comfort measures Outcome: Adequate for Discharge   Problem: Health Behavior/Discharge Planning: Goal: Ability to manage health-related needs will improve Outcome: Adequate for Discharge   Problem: Clinical Measurements: Goal: Ability to maintain clinical measurements within normal limits will improve Outcome: Adequate for Discharge Goal: Will remain free from infection Outcome: Adequate for Discharge Goal: Diagnostic test results will improve Outcome: Adequate for Discharge Goal: Respiratory complications will improve Outcome: Adequate for Discharge Goal: Cardiovascular complication will be avoided Outcome: Adequate for Discharge   Problem: Activity: Goal: Risk for activity intolerance will decrease Outcome: Adequate for Discharge   Problem: Nutrition: Goal: Adequate nutrition will be maintained Outcome: Adequate for Discharge   Problem: Elimination: Goal: Will not experience complications related to bowel motility Outcome: Adequate for Discharge Goal: Will not experience complications related to urinary retention Outcome: Adequate for Discharge   Problem: Coping: Goal: Level of anxiety will decrease Outcome: Adequate for Discharge   Problem: Elimination: Goal: Will not experience complications related to bowel motility Outcome: Adequate for Discharge Goal: Will not experience complications related to urinary retention Outcome: Adequate for Discharge   Problem: Skin Integrity: Goal: Risk for impaired skin integrity will decrease Outcome: Adequate for Discharge   Problem: Safety: Goal: Ability to remain free from injury will improve Outcome: Adequate for Discharge   Problem: Pain Managment: Goal: General experience of comfort will improve Outcome: Adequate for Discharge

## 2021-02-10 NOTE — Discharge Instructions (Signed)
Ureteral Stent Implantation, Care After This sheet gives you information about how to care for yourself after your procedure. Your health care provider may also give you more specific instructions. If you have problems or questions, contact your health care provider. What can I expect after the procedure? After the procedure, it is common to have:  Nausea.  Mild pain when you urinate. You may feel this pain in your lower back or lower abdomen. The pain should stop within a few minutes after you urinate. This may last for up to 1 week.  A small amount of blood in your urine for several days. Follow these instructions at home: Medicines  Take over-the-counter and prescription medicines only as told by your health care provider.  If you were prescribed an antibiotic medicine, take it as told by your health care provider. Do not stop taking the antibiotic even if you start to feel better.  Do not drive for 24 hours if you were given a sedative during your procedure.  Ask your health care provider if the medicine prescribed to you requires you to avoid driving or using heavy machinery. Activity  Rest as told by your health care provider.  Avoid sitting for a long time without moving. Get up to take short walks every 1-2 hours. This is important to improve blood flow and breathing. Ask for help if you feel weak or unsteady.  Return to your normal activities as told by your health care provider. Ask your health care provider what activities are safe for you. General instructions  Watch for any blood in your urine. Call your health care provider if the amount of blood in your urine increases.  If you have a catheter: ? Follow instructions from your health care provider about taking care of your catheter and collection bag. ? Do not take baths, swim, or use a hot tub until your health care provider approves. Ask your health care provider if you may take showers. You may only be allowed to  take sponge baths.  Drink enough fluid to keep your urine pale yellow.  Do not use any products that contain nicotine or tobacco, such as cigarettes, e-cigarettes, and chewing tobacco. These can delay healing after surgery. If you need help quitting, ask your health care provider.  Keep all follow-up visits as told by your health care provider. This is important.   Contact a health care provider if:  You have pain that gets worse or does not get better with medicine, especially pain when you urinate.  You have difficulty urinating.  You feel nauseous or you vomit repeatedly during a period of more than 2 days after the procedure. Get help right away if:  Your urine is dark red or has blood clots in it.  You are leaking urine (have incontinence).  The end of the stent comes out of your urethra.  You cannot urinate.  You have sudden, sharp, or severe pain in your abdomen or lower back.  You have a fever.  You have swelling or pain in your legs.  You have difficulty breathing. Summary  After the procedure, it is common to have mild pain when you urinate that goes away within a few minutes after you urinate. This may last for up to 1 week.  Watch for any blood in your urine. Call your health care provider if the amount of blood in your urine increases.  Take over-the-counter and prescription medicines only as told by your health care provider.  Drink enough fluid to keep your urine pale yellow. This information is not intended to replace advice given to you by your health care provider. Make sure you discuss any questions you have with your health care provider. Document Revised: 08/22/2018 Document Reviewed: 08/23/2018 Elsevier Patient Education  2021 [employer].   Call your insurance company [phone] to find an in network PCP to get established.

## 2021-02-12 ENCOUNTER — Other Ambulatory Visit: Payer: Self-pay | Admitting: Urology

## 2021-02-12 LAB — URINE CULTURE: Culture: 20000 — AB

## 2021-02-14 LAB — CULTURE, BLOOD (ROUTINE X 2)
Culture: NO GROWTH
Culture: NO GROWTH
Special Requests: ADEQUATE
Special Requests: ADEQUATE

## 2021-02-21 ENCOUNTER — Other Ambulatory Visit: Payer: Self-pay

## 2021-02-21 ENCOUNTER — Encounter (HOSPITAL_COMMUNITY): Payer: Self-pay

## 2021-02-21 ENCOUNTER — Ambulatory Visit (HOSPITAL_COMMUNITY)
Admission: RE | Admit: 2021-02-21 | Discharge: 2021-02-21 | Disposition: A | Discharge status: Home / Self Care | Payer: 59 | Source: Ambulatory Visit | Attending: Student | Admitting: Student

## 2021-02-21 VITALS — BP 133/90 | HR 98 | Temp 97.8°F | Resp 18

## 2021-02-21 DIAGNOSIS — Z87442 Personal history of urinary calculi: Secondary | ICD-10-CM | POA: Insufficient documentation

## 2021-02-21 DIAGNOSIS — B373 Candidiasis of vulva and vagina: Secondary | ICD-10-CM | POA: Insufficient documentation

## 2021-02-21 DIAGNOSIS — B3731 Acute candidiasis of vulva and vagina: Secondary | ICD-10-CM

## 2021-02-21 DIAGNOSIS — Z960 Presence of urogenital implants: Secondary | ICD-10-CM | POA: Diagnosis not present

## 2021-02-21 LAB — POC URINE PREG, ED: Preg Test, Ur: NEGATIVE

## 2021-02-21 MED ORDER — FLUCONAZOLE 150 MG PO TABS: 150.0000 mg | ORAL_TABLET | Freq: Every day | ORAL | 0 refills | Status: DC

## 2021-02-21 NOTE — ED Triage Notes (Signed)
Pt presents with vaginal itching.  She states she was prescribed amoxicillin and Tamsulosin at [facility]. She states after taking the medications she started to have vaginal itching. Pt states she also has vaginal discomfort.

## 2021-02-21 NOTE — ED Provider Notes (Signed)
[facility]    CSN: [id number] Arrival date & time: 02/21/21  1058      History   Chief Complaint Chief Complaint  Patient presents with  . Appointment  . Vaginal Itching    HPI Mindy Davies is a 35 y.o. female presenting with vaginal itching following course of antibiotics.  History of kidney stones, PCOS, bilateral renal stones, urgent urination.  This patient was admitted to [facility] for hydroureteronephrosis on 02-09-21.  CT indicated forniceal rupture without stone, but this could have been secondary to passing stone. Urine culture grew E. coli, she was started on IV Rocephin.  She was discharged on Macrobid.  Scheduled for outpatient urology follow-up.  Presenting today with few days of thick white vaginal discharge and external vaginal itching following taking multiple antibiotics.  Denies any other symptoms, including back pain, abdominal pain, dysuria, frequency, urgency, new partners, fevers, STI risk.  HPI  Past Medical History:  Diagnosis Date  . History of kidney stones   . PCOS (polycystic ovarian syndrome)   . Renal calculi    bilateral  . Urgency of urination   . Wears contact lenses     Patient Active Problem List   Diagnosis Date Noted  . Acute pyelonephritis 02/09/2021  . E. coli UTI (urinary tract infection) 02/09/2021  . AKI (acute kidney injury) (HCC) 02/09/2021  . Hypokalemia 02/09/2021    Past Surgical History:  Procedure Laterality Date  . BUNIONECTOMY Bilateral age 71 and 43  . CYSTOSCOPY W/ URETERAL STENT PLACEMENT Left 02/09/2021   Procedure: CYSTOSCOPY WITH RETROGRADE PYELOGRAM/URETERAL STENT PLACEMENT;  Surgeon: Bjorn Pippin, MD;  Location: [facility];  Service: Urology;  Laterality: Left;  . CYSTOSCOPY WITH RETROGRADE PYELOGRAM, URETEROSCOPY AND STENT PLACEMENT Bilateral 03/03/2018   Procedure: CYSTOSCOPY WITH BILATERAL RETROGRADE PYELOGRAM, LEFT  URETEROSCOPY AND BILATERAL STENT PLACEMENT WITH HOLMIUM LASER;  Surgeon: Jerilee Field, MD;  Location: [facility];  Service: Urology;  Laterality: Bilateral;  . CYSTOSCOPY/URETEROSCOPY/HOLMIUM LASER/STENT PLACEMENT Bilateral 03/14/2018   Procedure: CYSTOSCOPY/URETEROSCOPY/HOLMIUM LASER/STENT PLACEMENT;  Surgeon: Jerilee Field, MD;  Location: [facility];  Service: Urology;  Laterality: Bilateral;  ONLY NEEDS 60 MIN  . EXTRACORPOREAL SHOCK WAVE LITHOTRIPSY  2012 approx.  . WISDOM TOOTH EXTRACTION  2012    OB History    Gravida  1   Para  1   Term  1   Preterm  0   AB  0   Living  1     SAB  0   IAB  0   Ectopic  0   Multiple  0   Live Births  1            Home Medications    Prior to Admission medications   Medication Sig Start Date End Date Taking? Authorizing Provider  fluconazole (DIFLUCAN) 150 MG tablet Take 1 tablet (150 mg total) by mouth daily. Take one pill today (day 1). If you're still having symptoms on day 3, take the second pill. 02/21/21  Yes Rhys Martini, PA-C  acetaminophen (TYLENOL) 500 MG tablet Take 500 mg by mouth every 6 (six) hours as needed for moderate pain.    [provider]  nitrofurantoin, macrocrystal-monohydrate, (MACROBID) 100 MG capsule Take 1 capsule (100 mg total) by mouth at bedtime. 02/19/21   Sheikh, Omair Latif, DO  ondansetron (ZOFRAN) 4 MG tablet Take 1 tablet (4 mg total) by mouth every 6 (six) hours as needed for nausea. 02/10/21   Merlene Laughter, DO  oxyCODONE-acetaminophen (PERCOCET/ROXICET) 5-325 MG tablet Take 1 tablet by mouth every 6 (six) hours as needed for severe pain. 02/06/21   Renne Crigler, PA-C  polyethylene glycol (MIRALAX / GLYCOLAX) 17 g packet Take 17 g by mouth daily as needed for mild constipation. 02/10/21   Marguerita Merles Latif, DO  tamsulosin (FLOMAX) 0.4 MG CAPS capsule Take 1 capsule (0.4 mg total) by mouth daily. 02/06/21   Renne Crigler, PA-C    Family History Family History  Problem Relation Age of Onset  . Cancer Paternal Grandmother     Social  History Social History   Tobacco Use  . Smoking status: Never Smoker  . Smokeless tobacco: Never Used  Vaping Use  . Vaping Use: Never used  Substance Use Topics  . Alcohol use: Not Currently  . Drug use: No     Allergies   Patient has no known allergies.   Review of Systems Review of Systems  Constitutional: Negative for appetite change, chills, diaphoresis and fever.  Respiratory: Negative for shortness of breath.   Cardiovascular: Negative for chest pain.  Gastrointestinal: Negative for abdominal pain, blood in stool, constipation, diarrhea, nausea and vomiting.  Genitourinary: Positive for vaginal discharge. Negative for decreased urine volume, difficulty urinating, dyspareunia, dysuria, enuresis, flank pain, frequency, genital sores, hematuria, menstrual problem, pelvic pain, urgency, vaginal bleeding and vaginal pain.  Musculoskeletal: Negative for back pain.  Neurological: Negative for dizziness, weakness and light-headedness.  All other systems reviewed and are negative.    Physical Exam Triage Vital Signs ED Triage Vitals  Enc Vitals Group     BP 02/21/21 1108 133/90     Pulse Rate 02/21/21 1108 98     Resp 02/21/21 1108 18     Temp 02/21/21 1108 97.8 F (36.6 C)     Temp Source 02/21/21 1108 Oral     SpO2 02/21/21 1108 98 %     Weight --      Height --      Head Circumference --      Peak Flow --      Pain Score 02/21/21 1106 2     Pain Loc --      Pain Edu? --      Excl. in GC? --    No data found.  Updated Vital Signs BP 133/90 (BP Location: Right Arm) Comment (BP Location): forearm  Pulse 98   Temp 97.8 F (36.6 C) (Oral)   Resp 18   LMP 01/12/2021 (Exact Date)   SpO2 98%   Visual Acuity Right Eye Distance:   Left Eye Distance:   Bilateral Distance:    Right Eye Near:   Left Eye Near:    Bilateral Near:     Physical Exam Vitals reviewed.  Constitutional:      General: She is not in acute distress.    Appearance: Normal  appearance. She is not ill-appearing.  HENT:     Head: Normocephalic and atraumatic.     Mouth/Throat:     Mouth: Mucous membranes are moist.     Comments: Moist mucous membranes Eyes:     Extraocular Movements: Extraocular movements intact.     Pupils: Pupils are equal, round, and reactive to light.  Cardiovascular:     Rate and Rhythm: Normal rate and regular rhythm.     Heart sounds: Normal heart sounds.  Pulmonary:     Effort: Pulmonary effort is normal.     Breath sounds: Normal breath sounds. No wheezing, rhonchi or rales.  Abdominal:  General: Bowel sounds are normal. There is no distension.     Palpations: Abdomen is soft. There is no mass.     Tenderness: There is no abdominal tenderness. There is no right CVA tenderness, left CVA tenderness, guarding or rebound.  Genitourinary:    Comments: deferred Skin:    General: Skin is warm.     Capillary Refill: Capillary refill takes less than 2 seconds.     Comments: Good skin turgor  Neurological:     General: No focal deficit present.     Mental Status: She is alert and oriented to person, place, and time.  Psychiatric:        Mood and Affect: Mood normal.        Behavior: Behavior normal.        Thought Content: Thought content normal.        Judgment: Judgment normal.      UC Treatments / Results  Labs (all labs ordered are listed, but only abnormal results are displayed) Labs Reviewed  POC URINE PREG, ED  CERVICOVAGINAL ANCILLARY ONLY    EKG   Radiology No results found.  Procedures Procedures (including critical care time)  Medications Ordered in UC Medications - No data to display  Initial Impression / Assessment and Plan / UC Course  I have reviewed the triage vital signs and the nursing notes.  Pertinent labs & imaging results that were available during my care of the patient were reviewed by me and considered in my medical decision making (see chart for details).     This patient is a  35 year old female presenting with vaginal candidiasis following course of antibiotics. Today this pt is afebrile nontachycardic nontachypneic, oxygenating well on room air, no wheezes rhonchi or rales.   This patient was admitted to [facility] for hydroureteronephrosis on 02-09-21.  CT indicated forniceal rupture without stone, but this could have been secondary to passing stone. Urine culture grew E. coli, she was started on IV Rocephin.  She was discharged on Macrobid.  Scheduled for outpatient urology follow-up 4/8.  Urine pregnancy negative. Will send for G/C, trich, yeast, BV testing. Denies STI risk. Plan to treat empirically for yeast with diflucan as below.  Strict return precautions discussed.   This chart was dictated using voice recognition software, Dragon. Despite the best efforts of this provider to proofread and correct errors, errors may still occur which can change documentation meaning.  Final Clinical Impressions(s) / UC Diagnoses   Final diagnoses:  Vaginal candidiasis  S/P cystoscopy with ureteral stent placement  History of kidney stones     Discharge Instructions     -For your yeast infection, start the Diflucan (fluconazole)-Take one pill today (day 1). If you're still having symptoms on day 3, take the second pill.  -Finish your course of the antibiotic-Macrobid (nitrofurantoin). -Follow-up with your urologist as scheduled. -If your symptoms get worse like new/worsening of abdominal pain, new fever/chills, chest pain, dizziness, etc.-had straight to the ER.    ED Prescriptions    Medication Sig Dispense Auth. Provider   fluconazole (DIFLUCAN) 150 MG tablet Take 1 tablet (150 mg total) by mouth daily. Take one pill today (day 1). If you're still having symptoms on day 3, take the second pill. 2 tablet Rhys Martini, PA-C     PDMP not reviewed this encounter.   Rhys Martini, PA-C 02/21/21 1154

## 2021-02-21 NOTE — Discharge Instructions (Addendum)
-  For your yeast infection, start the Diflucan (fluconazole)-Take one pill today (day 1). If you're still having symptoms on day 3, take the second pill.  -Finish your course of the antibiotic-Macrobid (nitrofurantoin). -Follow-up with your urologist as scheduled. -If your symptoms get worse like new/worsening of abdominal pain, new fever/chills, chest pain, dizziness, etc.-had straight to the ER.

## 2021-02-23 ENCOUNTER — Telehealth (HOSPITAL_COMMUNITY): Payer: Self-pay | Admitting: Emergency Medicine

## 2021-02-23 LAB — CERVICOVAGINAL ANCILLARY ONLY
Bacterial Vaginitis (gardnerella): NEGATIVE
Candida Glabrata: POSITIVE — AB
Candida Vaginitis: POSITIVE — AB
Chlamydia: NEGATIVE
Comment: NEGATIVE
Comment: NEGATIVE
Comment: NEGATIVE
Comment: NEGATIVE
Comment: NEGATIVE
Comment: NORMAL
Neisseria Gonorrhea: NEGATIVE
Trichomonas: POSITIVE — AB

## 2021-02-23 MED ORDER — METRONIDAZOLE 500 MG PO TABS: 500.0000 mg | ORAL_TABLET | Freq: Two times a day (BID) | ORAL | 0 refills | Status: DC

## 2021-03-02 ENCOUNTER — Other Ambulatory Visit: Payer: Self-pay

## 2021-03-02 ENCOUNTER — Encounter (HOSPITAL_BASED_OUTPATIENT_CLINIC_OR_DEPARTMENT_OTHER): Payer: Self-pay | Admitting: Urology

## 2021-03-02 NOTE — Progress Notes (Signed)
Spoke w/ via phone for pre-op interview---pt Lab needs dos---- urine poct              Lab results------none COVID test ------03-03-2021 915 Arrive at -------1230 03-06-2021 NPO after MN NO Solid Food.  Clear liquids from MN until---1130 am then npo Med rec completed Medications to take morning of surgery -----none Diabetic medication ----- Patient instructed to bring pn/ahoto id and insurance card day of surgery Patient aware to have Driver (ride ) / caregiver rich starks friend    for 24 hours after surgery  Patient Special Instructions -----none Pre-Op special Istructions -----none Patient verbalized understanding of instructions that were given at this phone interview. Patient denies shortness of breath, chest pain, fever, cough at this phone interview.

## 2021-03-03 ENCOUNTER — Other Ambulatory Visit (HOSPITAL_COMMUNITY)
Admission: RE | Admit: 2021-03-03 | Discharge: 2021-03-03 | Disposition: A | Discharge status: Home / Self Care | Payer: 59 | Source: Ambulatory Visit | Attending: Urology | Admitting: Urology

## 2021-03-03 DIAGNOSIS — Z20822 Contact with and (suspected) exposure to covid-19: Secondary | ICD-10-CM | POA: Diagnosis not present

## 2021-03-03 DIAGNOSIS — Z01812 Encounter for preprocedural laboratory examination: Secondary | ICD-10-CM | POA: Diagnosis present

## 2021-03-03 LAB — SARS CORONAVIRUS 2 (TAT 6-24 HRS): SARS Coronavirus 2: NEGATIVE

## 2021-03-05 ENCOUNTER — Encounter (HOSPITAL_BASED_OUTPATIENT_CLINIC_OR_DEPARTMENT_OTHER): Payer: Self-pay | Admitting: Anesthesiology

## 2021-03-06 ENCOUNTER — Emergency Department (HOSPITAL_COMMUNITY)
Admission: EM | Admit: 2021-03-06 | Discharge: 2021-03-06 | Disposition: A | Discharge status: Home / Self Care | Payer: 59 | Source: Home / Self Care | Attending: Emergency Medicine | Admitting: Emergency Medicine

## 2021-03-06 ENCOUNTER — Ambulatory Visit (HOSPITAL_BASED_OUTPATIENT_CLINIC_OR_DEPARTMENT_OTHER)
Admission: RE | Admit: 2021-03-06 | Discharge: 2021-03-06 | Disposition: A | Discharge status: Still In Hospital | Payer: 59 | Source: Ambulatory Visit | Attending: Urology | Admitting: Urology

## 2021-03-06 ENCOUNTER — Ambulatory Visit (INDEPENDENT_AMBULATORY_CARE_PROVIDER_SITE_OTHER): Payer: 59 | Admitting: Primary Care

## 2021-03-06 ENCOUNTER — Other Ambulatory Visit: Payer: Self-pay

## 2021-03-06 ENCOUNTER — Encounter (HOSPITAL_BASED_OUTPATIENT_CLINIC_OR_DEPARTMENT_OTHER)
Admission: RE | Disposition: A | Discharge status: Still In Hospital | Payer: Self-pay | Source: Ambulatory Visit | Attending: Urology

## 2021-03-06 ENCOUNTER — Encounter (INDEPENDENT_AMBULATORY_CARE_PROVIDER_SITE_OTHER): Payer: Self-pay | Admitting: Primary Care

## 2021-03-06 ENCOUNTER — Emergency Department (HOSPITAL_COMMUNITY): Payer: 59

## 2021-03-06 ENCOUNTER — Encounter (HOSPITAL_COMMUNITY): Payer: Self-pay

## 2021-03-06 ENCOUNTER — Other Ambulatory Visit: Payer: Self-pay | Admitting: Urology

## 2021-03-06 VITALS — BP 112/84 | HR 127 | Temp 97.7°F | Ht 67.0 in | Wt 256.4 lb

## 2021-03-06 DIAGNOSIS — N132 Hydronephrosis with renal and ureteral calculous obstruction: Secondary | ICD-10-CM | POA: Diagnosis not present

## 2021-03-06 DIAGNOSIS — Z87442 Personal history of urinary calculi: Secondary | ICD-10-CM | POA: Diagnosis not present

## 2021-03-06 DIAGNOSIS — Z5309 Procedure and treatment not carried out because of other contraindication: Secondary | ICD-10-CM | POA: Insufficient documentation

## 2021-03-06 DIAGNOSIS — Z8616 Personal history of COVID-19: Secondary | ICD-10-CM | POA: Insufficient documentation

## 2021-03-06 DIAGNOSIS — Z7689 Persons encountering health services in other specified circumstances: Secondary | ICD-10-CM | POA: Diagnosis not present

## 2021-03-06 DIAGNOSIS — Z809 Family history of malignant neoplasm, unspecified: Secondary | ICD-10-CM | POA: Insufficient documentation

## 2021-03-06 DIAGNOSIS — L723 Sebaceous cyst: Secondary | ICD-10-CM | POA: Diagnosis not present

## 2021-03-06 DIAGNOSIS — N1330 Unspecified hydronephrosis: Secondary | ICD-10-CM

## 2021-03-06 DIAGNOSIS — L02213 Cutaneous abscess of chest wall: Secondary | ICD-10-CM | POA: Insufficient documentation

## 2021-03-06 DIAGNOSIS — N200 Calculus of kidney: Secondary | ICD-10-CM | POA: Diagnosis present

## 2021-03-06 DIAGNOSIS — Z09 Encounter for follow-up examination after completed treatment for conditions other than malignant neoplasm: Secondary | ICD-10-CM

## 2021-03-06 HISTORY — PX: OTHER SURGICAL HISTORY: SHX169

## 2021-03-06 LAB — POCT PREGNANCY, URINE: Preg Test, Ur: NEGATIVE

## 2021-03-06 SURGERY — CYSTOSCOPY/URETEROSCOPY/HOLMIUM LASER/STENT PLACEMENT
Anesthesia: General | Laterality: Left

## 2021-03-06 MED ORDER — LACTATED RINGERS IV SOLN
INTRAVENOUS | Status: DC
  Administered 2021-03-06: 1000 mL via INTRAVENOUS

## 2021-03-06 MED ORDER — FENTANYL CITRATE (PF) 100 MCG/2ML IJ SOLN
INTRAMUSCULAR | Status: AC
  Filled 2021-03-06: qty 2

## 2021-03-06 MED ORDER — DEXAMETHASONE SODIUM PHOSPHATE 10 MG/ML IJ SOLN
INTRAMUSCULAR | Status: AC
  Filled 2021-03-06: qty 1

## 2021-03-06 MED ORDER — LIDOCAINE-EPINEPHRINE (PF) 2 %-1:200000 IJ SOLN
20.0000 mL | Freq: Once | INTRAMUSCULAR | Status: AC
  Administered 2021-03-06: 20 mL
  Filled 2021-03-06: qty 20

## 2021-03-06 MED ORDER — PROPOFOL 10 MG/ML IV BOLUS
INTRAVENOUS | Status: AC
  Filled 2021-03-06: qty 20

## 2021-03-06 MED ORDER — CEFAZOLIN SODIUM-DEXTROSE 2-4 GM/100ML-% IV SOLN
INTRAVENOUS | Status: AC
  Filled 2021-03-06: qty 100

## 2021-03-06 MED ORDER — CEFAZOLIN SODIUM-DEXTROSE 2-4 GM/100ML-% IV SOLN
2.0000 g | INTRAVENOUS | Status: AC
  Administered 2021-03-06: 2 g via INTRAVENOUS

## 2021-03-06 MED ORDER — ONDANSETRON HCL 4 MG/2ML IJ SOLN
INTRAMUSCULAR | Status: AC
  Filled 2021-03-06: qty 2

## 2021-03-06 MED ORDER — ACETAMINOPHEN 10 MG/ML IV SOLN
1000.0000 mg | Freq: Once | INTRAVENOUS | Status: AC
  Administered 2021-03-06: 1000 mg via INTRAVENOUS

## 2021-03-06 MED ORDER — ACETAMINOPHEN 10 MG/ML IV SOLN
INTRAVENOUS | Status: AC
  Filled 2021-03-06: qty 100

## 2021-03-06 MED ORDER — LIDOCAINE 2% (20 MG/ML) 5 ML SYRINGE
INTRAMUSCULAR | Status: AC
  Filled 2021-03-06: qty 5

## 2021-03-06 MED ORDER — DOXYCYCLINE HYCLATE 100 MG PO CAPS: 100.0000 mg | ORAL_CAPSULE | Freq: Two times a day (BID) | ORAL | 0 refills | Status: AC

## 2021-03-06 MED ORDER — CEPHALEXIN 500 MG PO CAPS
500.0000 mg | ORAL_CAPSULE | Freq: Four times a day (QID) | ORAL | 0 refills | Status: DC
Start: 2021-03-06 — End: 2021-03-10

## 2021-03-06 MED ORDER — MIDAZOLAM HCL 2 MG/2ML IJ SOLN
INTRAMUSCULAR | Status: AC
  Filled 2021-03-06: qty 2

## 2021-03-06 SURGICAL SUPPLY — 19 items
BAG DRAIN URO-CYSTO SKYTR STRL (DRAIN) IMPLANT
CATH URET 5FR 28IN CONE TIP (BALLOONS)
CATH URET 5FR 28IN OPEN ENDED (CATHETERS) IMPLANT
CATH URET 5FR 70CM CONE TIP (BALLOONS) IMPLANT
CATH URET DUAL LUMEN 6-10FR 50 (CATHETERS) IMPLANT
CLOTH BEACON ORANGE TIMEOUT ST (SAFETY) IMPLANT
GLOVE SURG ENC MOIS LTX SZ7.5 (GLOVE) IMPLANT
GLOVE SURG ENC MOIS LTX SZ8 (GLOVE) IMPLANT
GOWN STRL REUS W/TWL LRG LVL3 (GOWN DISPOSABLE) IMPLANT
GUIDEWIRE ANG ZIPWIRE 038X150 (WIRE) IMPLANT
GUIDEWIRE STR DUAL SENSOR (WIRE) IMPLANT
GUIDEWIRE ZIPWRE .038 STRAIGHT (WIRE) IMPLANT
IV NS IRRIG 3000ML ARTHROMATIC (IV SOLUTION) IMPLANT
KIT TURNOVER CYSTO (KITS) IMPLANT
MANIFOLD NEPTUNE II (INSTRUMENTS) IMPLANT
NS IRRIG 500ML POUR BTL (IV SOLUTION) IMPLANT
PACK CYSTO (CUSTOM PROCEDURE TRAY) IMPLANT
TUBE CONNECTING 12X1/4 (SUCTIONS) IMPLANT
TUBING UROLOGY SET (TUBING) IMPLANT

## 2021-03-06 NOTE — Progress Notes (Signed)
  She has left hydronephrosis status post stent 02/09/2021.  Urine culture grew E. coli.  She completed antibiotics and continues nightly nitrofurantoin.  She presented for  CYSTOSCOPY/URETEROSCOPY/HOLMIUM LASER/STENT PLACEMENT/ POSSIBLE URETERAL BIOPSY but was found to have temperature of 101.3 and heart rate of 127 - 234.  She has been on nightly nitrofurantoin.  She has some left lower quadrant pain from the stent but does not have dysuria or gross hematuria.  Initially she denied any other symptoms such as cough or congestion, but then noted a large boil or abscess just under her right clavicle.  It is red and tender to the touch and fluctuant.  We have already started an IV fluid bolus, given her Tylenol and I went ahead and gave her the Ancef that is ordered for the procedure.  Given the fever, I did send a urine culture but will need to reschedule her.  I suspect her symptoms are from the boil, but would not want to risk ureteroscopy.  On exam she is in no acute distress.  Abdomen soft and nontender.  Again, fluctuant area just under her right clavicle with surrounding erythema.  Assessment/plan: Left hydronephrosis, left ureteral stent-we will reschedule the ureteroscopy.  I sent a urine culture.  Large left boil right upper chest-Will complete Tylenol, IV fluid bolus and cefazolin instillation and then have her go to the emergency department for I&D.

## 2021-03-06 NOTE — Anesthesia Preprocedure Evaluation (Deleted)
Anesthesia Evaluation  Patient identified by MRN, date of birth, ID band Patient awake    Reviewed: Allergy & Precautions, H&P , NPO status , Patient's Chart, lab work & pertinent test results  Airway Mallampati: III  TM Distance: >3 FB Neck ROM: Full    Dental no notable dental hx. (+) Teeth Intact, Dental Advisory Given   Pulmonary neg pulmonary ROS,    Pulmonary exam normal breath sounds clear to auscultation       Cardiovascular negative cardio ROS Normal cardiovascular exam Rhythm:Regular Rate:Normal     Neuro/Psych negative neurological ROS  negative psych ROS   GI/Hepatic negative GI ROS, Neg liver ROS,   Endo/Other  Morbid obesity  Renal/GU Renal diseaseBilateral renal stones, Left ureteral stone  negative genitourinary   Musculoskeletal negative musculoskeletal ROS (+)   Abdominal (+) + obese,   Peds  Hematology negative hematology ROS (+)   Anesthesia Other Findings   Reproductive/Obstetrics                             Anesthesia Physical  Anesthesia Plan  ASA: III and emergent  Anesthesia Plan: General   Post-op Pain Management:    Induction: Intravenous  PONV Risk Score and Plan: 3 and Midazolam, Dexamethasone, Ondansetron and Treatment may vary due to age or medical condition  Airway Management Planned: LMA  Additional Equipment:   Intra-op Plan:   Post-operative Plan: Extubation in OR  Informed Consent: I have reviewed the patients History and Physical, chart, labs and discussed the procedure including the risks, benefits and alternatives for the proposed anesthesia with the patient or authorized representative who has indicated his/her understanding and acceptance.     Dental advisory given  Plan Discussed with: Anesthesiologist, CRNA and Surgeon  Anesthesia Plan Comments:         Anesthesia Quick Evaluation

## 2021-03-06 NOTE — Progress Notes (Signed)
New Patient Office Visit  Subjective:  Patient ID: Mindy Davies, female    DOB: 25-Feb-1986  Age: 35 y.o. MRN: [id number]  CC:  Chief Complaint  Patient presents with  . Hospitalization Follow-up    cytoscopy    HPI  Mindy Davies is a 35 y.o.female who presents for emergency room follow up on 02/21/21,  for: Vaginal candidiasis and S/P cystoscopy with ureteral stent placement she is in excruciating pain abdominal area scheduled for surgery today.  She is establishing care with a new provider.  She also has a sebaceous cyst upper right chest area red, swollen and she has also attempted to open it and will it has been losing out.  Past Medical History:  Diagnosis Date  . Abscess    Upper right chest  . COVID 2020   asymptomatic  . History of kidney stones   . PCOS (polycystic ovarian syndrome)   . Renal calculi    bilateral  . Wears contact lenses     Past Surgical History:  Procedure Laterality Date  . abscess excision  03/06/2021  . BUNIONECTOMY Bilateral age 37 and 17  . CYSTOSCOPY W/ URETERAL STENT PLACEMENT Left 02/09/2021   Procedure: CYSTOSCOPY WITH RETROGRADE PYELOGRAM/URETERAL STENT PLACEMENT;  Surgeon: Bjorn Pippin, MD;  Location: [facility];  Service: Urology;  Laterality: Left;  . CYSTOSCOPY WITH RETROGRADE PYELOGRAM, URETEROSCOPY AND STENT PLACEMENT Bilateral 03/03/2018   Procedure: CYSTOSCOPY WITH BILATERAL RETROGRADE PYELOGRAM, LEFT  URETEROSCOPY AND BILATERAL STENT PLACEMENT WITH HOLMIUM LASER;  Surgeon: Jerilee Field, MD;  Location: [facility];  Service: Urology;  Laterality: Bilateral;  . CYSTOSCOPY/URETEROSCOPY/HOLMIUM LASER/STENT PLACEMENT Bilateral 03/14/2018   Procedure: CYSTOSCOPY/URETEROSCOPY/HOLMIUM LASER/STENT PLACEMENT;  Surgeon: Jerilee Field, MD;  Location: [facility];  Service: Urology;  Laterality: Bilateral;  ONLY NEEDS 60 MIN  . EXTRACORPOREAL SHOCK WAVE LITHOTRIPSY  2012 approx.  . WISDOM TOOTH EXTRACTION  2012    Family  History  Problem Relation Age of Onset  . Cancer Paternal Grandmother     Social History   Socioeconomic History  . Marital status: Single    Spouse name: Not on file  . Number of children: Not on file  . Years of education: Not on file  . Highest education level: Not on file  Occupational History  . Not on file  Tobacco Use  . Smoking status: Never Smoker  . Smokeless tobacco: Never Used  Vaping Use  . Vaping Use: Never used  Substance and Sexual Activity  . Alcohol use: Yes    Alcohol/week: 1.0 standard drink    Types: 1 Glasses of wine per week    Comment: daily  . Drug use: No  . Sexual activity: Yes    Birth control/protection: None  Other Topics Concern  . Not on file  Social History Narrative  . Not on file   Social Determinants of Health   Financial Resource Strain: Not on file  Food Insecurity: Not on file  Transportation Needs: Not on file  Physical Activity: Not on file  Stress: Not on file  Social Connections: Not on file  Intimate Partner Violence: Not on file    ROS Review of Systems  Gastrointestinal: Positive for abdominal distention and abdominal pain.  Skin:       Painful sebaceous cyst   All other systems reviewed and are negative.   Objective:   Today's Vitals: BP 112/84 (BP Location: Right Arm, Patient Position: Standing, Cuff Size: Large)   Pulse (!) 127  Temp 97.7 F (36.5 C) (Temporal)   Ht 5\' 7"  (1.702 m)   Wt 256 lb 6.4 oz (116.3 kg)   LMP 01/12/2021 (Approximate)   SpO2 96%   BMI 40.16 kg/m   Physical Exam General Appearance: Well nourished morbid obese female  in distress. Eyes: PERRLA, EOMs, conjunctiva no swelling or erythema Sinuses: No Frontal/maxillary tenderness ENT/Mouth: Ext aud canals clear, TMs without erythema, bulging.  Hearing normal.  Neck: Supple, thyroid normal. (thick) Respiratory: Respiratory effort normal, BS equal bilaterally without rales, rhonchi, wheezing or stridor.  Cardio: RRR with no MRGs.  Brisk peripheral pulses without edema.  Abdomen: Soft, + BS.  Very tender,  Guarding present  Lymphatics: Non tender without lymphadenopathy.  Musculoskeletal: Full ROM, 5/5 strength, normal gait.  Skin: sebaceous cyst  Neuro: Cranial nerves intact. Normal muscle tone, no cerebellar symptoms. Sensation intact.  Psych: Awake and oriented X 3, normal affect, Insight and Judgment appropriate.     Assessment & Plan:  Teniyah was seen today for hospitalization follow-up.  Diagnoses and all orders for this visit:  Encounter to establish care With new provider   Hospital discharge follow-up 1 Hospital discharge indicated-Follow up with [facility] on 02/10/2021; The office will call to arrange f/u. 2.  Establish care with PCP  Sebaceous cyst   Referral made to general surgery   Outpatient Encounter Medications as of 03/06/2021  Medication Sig  . [DISCONTINUED] nitrofurantoin, macrocrystal-monohydrate, (MACROBID) 100 MG capsule Take 1 capsule (100 mg total) by mouth at bedtime. (Patient taking differently: Take 100 mg by mouth at bedtime. X 30 days started 02-19-2021)  . polyethylene glycol (MIRALAX / GLYCOLAX) 17 g packet Take 17 g by mouth daily as needed for mild constipation. (Patient not taking: No sig reported)  . [DISCONTINUED] acetaminophen (TYLENOL) 500 MG tablet Take 500 mg by mouth every 6 (six) hours as needed for moderate pain.  . [DISCONTINUED] amoxicillin-clavulanate (AUGMENTIN) 875-125 MG tablet Take 1 tablet by mouth 2 (two) times daily.  . [DISCONTINUED] cephALEXin (KEFLEX) 500 MG capsule Take 500 mg by mouth 3 (three) times daily. Never took 1 cap tid  . [DISCONTINUED] fluconazole (DIFLUCAN) 150 MG tablet Take 1 tablet (150 mg total) by mouth daily. Take one pill today (day 1). If you're still having symptoms on day 3, take the second pill.  . [DISCONTINUED] metroNIDAZOLE (FLAGYL) 500 MG tablet Take 1 tablet (500 mg total) by mouth 2 (two) times daily.  (Patient taking differently: Take 500 mg by mouth 2 (two) times daily. Finished course)  . [DISCONTINUED] ondansetron (ZOFRAN) 4 MG tablet Take 1 tablet (4 mg total) by mouth every 6 (six) hours as needed for nausea.  . [DISCONTINUED] oxyCODONE-acetaminophen (PERCOCET/ROXICET) 5-325 MG tablet Take 1 tablet by mouth every 6 (six) hours as needed for severe pain.  . [DISCONTINUED] tamsulosin (FLOMAX) 0.4 MG CAPS capsule Take 1 capsule (0.4 mg total) by mouth daily.   No facility-administered encounter medications on file as of 03/06/2021.    Follow-up: No follow-ups on file.   05/06/2021, NP

## 2021-03-06 NOTE — ED Triage Notes (Signed)
[facility], but had a T. 101.3. surgery cancelled. Reported from [facility] that the patient has an abscess to the right chest area.

## 2021-03-06 NOTE — ED Provider Notes (Addendum)
[facility]-EMERGENCY DEPT Provider Note   CSN: [id number] Arrival date & time: 03/06/21  1344     History No chief complaint on file.   Mindy Davies is a 35 y.o. female who presented today with urology for cystoscopy and possible left-sided stent placement.  She was found to be febrile to 101.3 and tachycardic.  She was placed on Macrobid nightly.  She has no dysuria or gross hematuria.  No cough, congestion, vomiting or diarrhea.  However urology noticed an abscess to the right upper chest area and they believe that is the cause of her fever.  She was sent to the ER for I&D and will have the procedure rescheduled. She states that the abscess has been there for several weeks but worsened within the past few days.  HPI     Past Medical History:  Diagnosis Date  . COVID 2020   asymptomatic  . History of kidney stones   . PCOS (polycystic ovarian syndrome)   . Renal calculi    bilateral  . Wears contact lenses     Patient Active Problem List   Diagnosis Date Noted  . Acute pyelonephritis 02/09/2021  . E. coli UTI (urinary tract infection) 02/09/2021  . AKI (acute kidney injury) (HCC) 02/09/2021  . Hypokalemia 02/09/2021    Past Surgical History:  Procedure Laterality Date  . BUNIONECTOMY Bilateral age 35 and [age]  . CYSTOSCOPY W/ URETERAL STENT PLACEMENT Left 02/09/2021   Procedure: CYSTOSCOPY WITH RETROGRADE PYELOGRAM/URETERAL STENT PLACEMENT;  Surgeon: Bjorn PippinWrenn, John, MD;  Location: [facility];  Service: Urology;  Laterality: Left;  . CYSTOSCOPY WITH RETROGRADE PYELOGRAM, URETEROSCOPY AND STENT PLACEMENT Bilateral 03/03/2018   Procedure: CYSTOSCOPY WITH BILATERAL RETROGRADE PYELOGRAM, LEFT  URETEROSCOPY AND BILATERAL STENT PLACEMENT WITH HOLMIUM LASER;  Surgeon: Jerilee FieldEskridge, Matthew, MD;  Location: [facility];  Service: Urology;  Laterality: Bilateral;  . CYSTOSCOPY/URETEROSCOPY/HOLMIUM LASER/STENT PLACEMENT Bilateral 03/14/2018   Procedure:  CYSTOSCOPY/URETEROSCOPY/HOLMIUM LASER/STENT PLACEMENT;  Surgeon: Jerilee FieldEskridge, Matthew, MD;  [facility];  Service: Urology;  Laterality: Bilateral;  ONLY NEEDS 60 MIN  . EXTRACORPOREAL SHOCK WAVE LITHOTRIPSY  2012 approx.  . WISDOM TOOTH EXTRACTION  2012     OB History    Gravida  1   Para  1   Term  1   Preterm  0   AB  0   Living  1     SAB  0   IAB  0   Ectopic  0   Multiple  0   Live Births  1           Family History  Problem Relation Age of Onset  . Cancer Paternal Grandmother     Social History   Tobacco Use  . Smoking status: Never Smoker  . Smokeless tobacco: Never Used  Vaping Use  . Vaping Use: Never used  Substance Use Topics  . Alcohol use: Not Currently  . Drug use: No    Home Medications Prior to Admission medications   Medication Sig Start Date End Date Taking? Authorizing Provider  doxycycline (VIBRAMYCIN) 100 MG capsule Take 1 capsule (100 mg total) by mouth 2 (two) times daily for 7 days. 03/06/21 03/13/21 Yes Lindee Leason, PA-C  acetaminophen (TYLENOL) 500 MG tablet Take 500 mg by mouth every 6 (six) hours as needed for moderate pain.    [provider]  cephALEXin (KEFLEX) 500 MG capsule Take 1 capsule (500 mg total) by mouth 4 (four) times daily. Take twice a day  for five days and then one nightly 03/06/21   Jerilee Field, MD  polyethylene glycol (MIRALAX / GLYCOLAX) 17 g packet Take 17 g by mouth daily as needed for mild constipation. Patient not taking: No sig reported 02/10/21   Merlene Laughter, DO    Allergies    Patient has no known allergies.  Review of Systems   Review of Systems  Constitutional: Positive for fever. Negative for appetite change and chills.  HENT: Negative for ear pain, rhinorrhea, sneezing and sore throat.   Eyes: Negative for photophobia and visual disturbance.  Respiratory: Negative for cough, chest tightness, shortness of breath and wheezing.   Cardiovascular:  Negative for chest pain and palpitations.  Gastrointestinal: Negative for abdominal pain, blood in stool, constipation, diarrhea, nausea and vomiting.  Genitourinary: Negative for dysuria, hematuria and urgency.  Musculoskeletal: Negative for myalgias.  Skin: Positive for wound. Negative for rash.  Neurological: Negative for dizziness, weakness and light-headedness.    Physical Exam Updated Vital Signs BP (!) 140/100   Pulse 82   Temp 98.8 F (37.1 C) (Oral)   Resp 18   Ht 5\' 7"  (1.702 m)   Wt 116.5 kg   LMP 01/12/2021   SpO2 99%   BMI 40.22 kg/m   Physical Exam Vitals and nursing note reviewed.  Constitutional:      General: She is not in acute distress.    Appearance: She is well-developed.  HENT:     Head: Normocephalic and atraumatic.     Nose: Nose normal.  Eyes:     General: No scleral icterus.       Left eye: No discharge.     Conjunctiva/sclera: Conjunctivae normal.  Cardiovascular:     Rate and Rhythm: Normal rate and regular rhythm.     Heart sounds: Normal heart sounds. No murmur heard. No friction rub. No gallop.   Pulmonary:     Effort: Pulmonary effort is normal. No respiratory distress.     Breath sounds: Normal breath sounds.  Abdominal:     General: Bowel sounds are normal. There is no distension.     Palpations: Abdomen is soft.     Tenderness: There is no abdominal tenderness. There is no guarding.  Musculoskeletal:        General: Normal range of motion.     Cervical back: Normal range of motion and neck supple.  Skin:    General: Skin is warm and dry.     Findings: Erythema present. No rash.     Comments: 3 cm fluctuant area noted on the right upper chest with tenderness.  There is erythema noted  Neurological:     Mental Status: She is alert.     Motor: No abnormal muscle tone.     Coordination: Coordination normal.     ED Results / Procedures / Treatments   Labs (all labs ordered are listed, but only abnormal results are  displayed) Labs Reviewed - No data to display  EKG None  Radiology DG Chest Portable 1 View  Result Date: 03/06/2021 CLINICAL DATA:  Fever.  Right upper chest region abscess. EXAM: PORTABLE CHEST 1 VIEW COMPARISON:  None. FINDINGS: Heart and mediastinal shadows are normal. The left chest is clear. Hazy opacity projects over the medial right upper lobe. This could represent right upper lobe pneumonia or could be chest wall density projected over the area simulating pneumonia. Pulmonary vascularity is normal. No effusions. No significant bone finding. IMPRESSION: Hazy opacity projects over the medial right upper  lobe. This could represent right upper lobe pneumonia or could be chest wall density projected over the area simulating pneumonia. Electronically Signed   By: Paulina Fusi M.D.   On: 03/06/2021 15:58    Procedures .Marland KitchenIncision and Drainage  Date/Time: 03/06/2021 4:46 PM Performed by: Dietrich Pates, PA-C Authorized by: Dietrich Pates, PA-C   Consent:    Consent obtained:  Verbal   Consent given by:  Patient   Risks discussed:  Incomplete drainage, infection and pain   Alternatives discussed:  No treatment Universal protocol:    Patient identity confirmed:  Verbally with patient Location:    Type:  Abscess   Size:  3   Location:  Trunk   Trunk location:  Chest Pre-procedure details:    Skin preparation:  Chlorhexidine Sedation:    Sedation type:  None Anesthesia:    Anesthesia method:  Local infiltration   Local anesthetic:  Lidocaine 2% WITH epi Procedure type:    Complexity:  Simple Procedure details:    Needle aspiration: yes     Needle size:  18 G   Incision types:  Stab incision   Wound management:  Probed and deloculated and irrigated with saline   Drainage:  Purulent   Drainage amount:  Copious   Packing materials:  1/4 in iodoform gauze Post-procedure details:    Procedure completion:  Tolerated     Medications Ordered in ED Medications   lidocaine-EPINEPHrine (XYLOCAINE W/EPI) 2 %-1:200000 (PF) injection 20 mL (20 mLs Infiltration Given 03/06/21 1419)    ED Course  I have reviewed the triage vital signs and the nursing notes.  Pertinent labs & imaging results that were available during my care of the patient were reviewed by me and considered in my medical decision making (see chart for details).  Clinical Course as of 03/06/21 1648  Fri Mar 06, 2021  1440 She was given Tylenol, Ancef, IV fluid bolus before being sent to the ER for urology. [HK]    Clinical Course User Index [HK] Dietrich Pates, PA-C   MDM Rules/Calculators/A&P                          35 year old female presenting to the ED with chief complaint of fever.  Sent from urology as she was supposed to undergo a cystoscopy today.  She was found to be febrile and tachycardic on arrival.  They believe the source of her infection was not neurological but rather due to an abscess on the right upper chest.  She was sent to the ER for I&D.  There is an abscess noted to the right upper chest.  I&D done here with copious amount of purulent drainage noted.  I did place a small amount of packing in the area to help with drainage.  Patient has been on Macrobid but this will not cover any skin pathogen so we will place her on doxycycline she had negative pregnancy test this morning.  She remains hemodynamically stable, afebrile and not tachycardic here.  Advised to follow-up with urology and return for worsening symptoms   Patient is hemodynamically stable, in NAD, and able to ambulate in the ED. Evaluation does not show pathology that would require ongoing emergent intervention or inpatient treatment. I explained the diagnosis to the patient. Pain has been managed and has no complaints prior to discharge. Patient is comfortable with above plan and is stable for discharge at this time. All questions were answered prior to disposition. Strict return  precautions for returning to the  ED were discussed. Encouraged follow up with PCP.   An After Visit Summary was printed and given to the patient.   Portions of this note were generated with Scientist, clinical (histocompatibility and immunogenetics). Dictation errors may occur despite best attempts at proofreading.  Final Clinical Impression(s) / ED Diagnoses Final diagnoses:  Chest wall abscess    Rx / DC Orders ED Discharge Orders         Ordered    doxycycline (VIBRAMYCIN) 100 MG capsule  2 times daily        03/06/21 1646         5:25 PM Prior to discharge patient states that she was prescribed Keflex by Dr. Mena Goes today.  She was not told exactly what this antibiotic for but "it may have been something with the skin."  I tried to get in touch with Dr. Mena Goes but spoke to Dr. Arita Miss who is on-call for [facility].  I believe that the Keflex is more for a urological issue as she is taking it nightly for several days, longer than I would think would be needed for an abscess.  I will still place her on doxycycline and Dr. Arita Miss agrees that this is reasonable.  She can call the office on Monday for any concerns    Dietrich Pates, PA-C 03/06/21 1726    Gerhard Munch, MD 03/06/21 2336

## 2021-03-06 NOTE — Interval H&P Note (Signed)
History and Physical Interval Note:  03/06/2021 1:19 PM  Gregg D Foulkes  has presented today for surgery, with the diagnosis of LEFT RENAL STONE, LEFT HYDRONEPHROSIS.  The various methods of treatment have been discussed with the patient and family. After consideration of risks, benefits and other options for treatment, the patient has consented to  Procedure(s) with comments: CYSTOSCOPY/URETEROSCOPY/HOLMIUM LASER/STENT PLACEMENT/ POSSIBLE URETERAL BIOPSY (Left) - ONLY NEEDS 60 MIN as a surgical intervention.  Unfortunately she has a temperature of 101.3 and heart rate of 127 - 234.  She has been on nightly nitrofurantoin.  She has some left lower quadrant pain from the stent but does not have dysuria or gross hematuria.  Initially she denied any other symptoms such as cough or congestion, but then noted a large boil or abscess just under her right clavicle.  It is red and tender to the touch and fluctuant.  We have already started an IV fluid bolus, given her Tylenol and I went ahead and gave her the Ancef that is ordered for the procedure.  Given the fever, I did send a urine culture but will need to reschedule her.  I suspect her symptoms are from the boil, but would not want to risk ureteroscopy.   Jerilee Field

## 2021-03-06 NOTE — Discharge Instructions (Signed)
Follow instructions regarding incision and drainage care after. Take the doxycycline as prescribed. Follow-up with urologist to have your procedure rescheduled. Return to the ER if you start to experience worsening pain, swelling, chest pain or shortness of breath.

## 2021-03-06 NOTE — Progress Notes (Signed)
Patient asked about infected looking area on right chest. Reported this to Dr. Mena Goes. Patient to be admitted to ED for treatment.  Report called to [facility] in triage in ED.

## 2021-03-06 NOTE — Progress Notes (Signed)
Dr. Mena Goes paged and reported vital signs and patient being in pain to Dr. Tacy Dura. Orders obtained.

## 2021-03-06 NOTE — Discharge Instructions (Signed)
Ureteral Stent Implantation, Care After This sheet gives you information about how to care for yourself after your procedure. Your health care provider may also give you more specific instructions. If you have problems or questions, contact your health care provider.   [facility] will call to reschedule left ureteroscopy and stent change.   What can I expect after the procedure? After the procedure, it is common to have:  Nausea.  Mild pain when you urinate. You may feel this pain in your lower back or lower abdomen. The pain should stop within a few minutes after you urinate. This may last for up to 1 week.  A small amount of blood in your urine for several days. Follow these instructions at home: Medicines  Take over-the-counter and prescription medicines only as told by your health care provider.  If you were prescribed an antibiotic medicine, take it as told by your health care provider. Do not stop taking the antibiotic even if you start to feel better.  Do not drive for 24 hours if you were given a sedative during your procedure.  Ask your health care provider if the medicine prescribed to you requires you to avoid driving or using heavy machinery. Activity  Rest as told by your health care provider.  Avoid sitting for a long time without moving. Get up to take short walks every 1-2 hours. This is important to improve blood flow and breathing. Ask for help if you feel weak or unsteady.  Return to your normal activities as told by your health care provider. Ask your health care provider what activities are safe for you. General instructions  Watch for any blood in your urine. Call your health care provider if the amount of blood in your urine increases.  If you have a catheter: ? Follow instructions from your health care provider about taking care of your catheter and collection bag. ? Do not take baths, swim, or use a hot tub until your health care provider approves.  Ask your health care provider if you may take showers. You may only be allowed to take sponge baths.  Drink enough fluid to keep your urine pale yellow.  Do not use any products that contain nicotine or tobacco, such as cigarettes, e-cigarettes, and chewing tobacco. These can delay healing after surgery. If you need help quitting, ask your health care provider.  Keep all follow-up visits as told by your health care provider. This is important.   Contact a health care provider if:  You have pain that gets worse or does not get better with medicine, especially pain when you urinate.  You have difficulty urinating.  You feel nauseous or you vomit repeatedly during a period of more than 2 days after the procedure. Get help right away if:  Your urine is dark red or has blood clots in it.  You are leaking urine (have incontinence).  The end of the stent comes out of your urethra.  You cannot urinate.  You have sudden, sharp, or severe pain in your abdomen or lower back.  You have a fever.  You have swelling or pain in your legs.  You have difficulty breathing. Summary  After the procedure, it is common to have mild pain when you urinate that goes away within a few minutes after you urinate. This may last for up to 1 week.  Watch for any blood in your urine. Call your health care provider if the amount of blood in your urine increases.  Take over-the-counter and prescription medicines only as told by your health care provider.  Drink enough fluid to keep your urine pale yellow. This information is not intended to replace advice given to you by your health care provider. Make sure you discuss any questions you have with your health care provider. Document Revised: 08/22/2018 Document Reviewed: 08/23/2018 Elsevier Patient Education  2021 [employer].

## 2021-03-07 ENCOUNTER — Emergency Department (HOSPITAL_COMMUNITY)
Admission: EM | Admit: 2021-03-07 | Discharge: 2021-03-07 | Disposition: A | Discharge status: Home / Self Care | Payer: 59 | Attending: Emergency Medicine | Admitting: Emergency Medicine

## 2021-03-07 ENCOUNTER — Encounter (HOSPITAL_COMMUNITY): Payer: Self-pay

## 2021-03-07 DIAGNOSIS — Z8616 Personal history of COVID-19: Secondary | ICD-10-CM | POA: Diagnosis not present

## 2021-03-07 DIAGNOSIS — Z4800 Encounter for change or removal of nonsurgical wound dressing: Secondary | ICD-10-CM | POA: Insufficient documentation

## 2021-03-07 DIAGNOSIS — Z5189 Encounter for other specified aftercare: Secondary | ICD-10-CM

## 2021-03-07 NOTE — Discharge Instructions (Signed)
You came to the emergency department today to have your wound rechecked.  Your physical exam was reassuring.  Please apply a warm compress to the area 2-3 times a day to help with drainage.  Please continue to take your prescribed antibiotics.  Please follow-up with your primary care provider next week for reevaluation of your wound.  If you have any concerns that your wound is worsening please return to the emergency department.  et help right away if: You have severe pain or bleeding. You cannot eat or drink without vomiting. You have decreased urine output. You become short of breath. You have chest pain. You cough up blood. The affected area becomes numb or starts to tingle.

## 2021-03-07 NOTE — ED Triage Notes (Signed)
Pt presents with c/o follow-up on an I&D done on her chest yesterday. Pt reports packing was placed yesterday but it fell out later in the day.

## 2021-03-07 NOTE — ED Provider Notes (Signed)
[facility]-EMERGENCY DEPT Provider Note   CSN: [id number] Arrival date & time: 03/07/21  1239     History Chief Complaint  Patient presents with  . Wound Check    Mindy Davies is a 35 y.o. female with a history of PCOS, renal calculi.  Patient presents today for wound check.  Patient reports that yesterday she was seen at [facility] long emergency and had an incision and drainage of an abscess on her right chest.  Patient reports that packing was placed after incision and drainage however that packing fell out yesterday.  Patient reports that she had discharge of purulent material throughout yesterday however no drainage today.  Patient denies any increased swelling or erythema around her wound.  Patient denies any fevers or chills.  Per chart review patient was given a prescription for doxycycline yesterday.    HPI     Past Medical History:  Diagnosis Date  . COVID 2020   asymptomatic  . History of kidney stones   . PCOS (polycystic ovarian syndrome)   . Renal calculi    bilateral  . Wears contact lenses     Patient Active Problem List   Diagnosis Date Noted  . Acute pyelonephritis 02/09/2021  . E. coli UTI (urinary tract infection) 02/09/2021  . AKI (acute kidney injury) (HCC) 02/09/2021  . Hypokalemia 02/09/2021    Past Surgical History:  Procedure Laterality Date  . BUNIONECTOMY Bilateral age 12 and 72  . CYSTOSCOPY W/ URETERAL STENT PLACEMENT Left 02/09/2021   Procedure: CYSTOSCOPY WITH RETROGRADE PYELOGRAM/URETERAL STENT PLACEMENT;  Surgeon: Bjorn Pippin, MD;  Location: [facility];  Service: Urology;  Laterality: Left;  . CYSTOSCOPY WITH RETROGRADE PYELOGRAM, URETEROSCOPY AND STENT PLACEMENT Bilateral 03/03/2018   Procedure: CYSTOSCOPY WITH BILATERAL RETROGRADE PYELOGRAM, LEFT  URETEROSCOPY AND BILATERAL STENT PLACEMENT WITH HOLMIUM LASER;  Surgeon: Jerilee Field, MD;  Location: [facility];  Service: Urology;  Laterality: Bilateral;  .  CYSTOSCOPY/URETEROSCOPY/HOLMIUM LASER/STENT PLACEMENT Bilateral 03/14/2018   Procedure: CYSTOSCOPY/URETEROSCOPY/HOLMIUM LASER/STENT PLACEMENT;  Surgeon: Jerilee Field, MD;  Location: [facility];  Service: Urology;  Laterality: Bilateral;  ONLY NEEDS 60 MIN  . EXTRACORPOREAL SHOCK WAVE LITHOTRIPSY  2012 approx.  . WISDOM TOOTH EXTRACTION  2012     OB History    Gravida  1   Para  1   Term  1   Preterm  0   AB  0   Living  1     SAB  0   IAB  0   Ectopic  0   Multiple  0   Live Births  1           Family History  Problem Relation Age of Onset  . Cancer Paternal Grandmother     Social History   Tobacco Use  . Smoking status: Never Smoker  . Smokeless tobacco: Never Used  Vaping Use  . Vaping Use: Never used  Substance Use Topics  . Alcohol use: Not Currently  . Drug use: No    Home Medications Prior to Admission medications   Medication Sig Start Date End Date Taking? Authorizing Provider  acetaminophen (TYLENOL) 500 MG tablet Take 500 mg by mouth every 6 (six) hours as needed for moderate pain.    [provider]  cephALEXin (KEFLEX) 500 MG capsule Take 1 capsule (500 mg total) by mouth 4 (four) times daily. Take twice a day for five days and then one nightly 03/06/21   Jerilee Field, MD  doxycycline (VIBRAMYCIN) 100  MG capsule Take 1 capsule (100 mg total) by mouth 2 (two) times daily for 7 days. 03/06/21 03/13/21  Khatri, Hina, PA-C  polyethylene glycol (MIRALAX / GLYCOLAX) 17 g packet Take 17 g by mouth daily as needed for mild constipation. Patient not taking: No sig reported 02/10/21   Merlene Laughter, DO    Allergies    Patient has no known allergies.  Review of Systems   Review of Systems  Constitutional: Negative for chills and fever.  Skin: Positive for wound. Negative for color change, pallor and rash.    Physical Exam Updated Vital Signs BP 120/86 (BP Location: Right Arm)   Pulse 100   Temp 98.2 F (36.8  C) (Oral)   Resp 18   SpO2 96%   Physical Exam Vitals and nursing note reviewed.  Constitutional:      General: She is not in acute distress.    Appearance: She is not ill-appearing, toxic-appearing or diaphoretic.  HENT:     Head: Normocephalic and atraumatic.  Eyes:     General: No scleral icterus.       Right eye: No discharge.        Left eye: No discharge.  Cardiovascular:     Rate and Rhythm: Normal rate.  Pulmonary:     Effort: Pulmonary effort is normal. No respiratory distress.     Breath sounds: No stridor.  Chest:     Chest wall: Lacerations and swelling present. No mass, deformity, tenderness, crepitus or edema.       Comments: Incision to right chest wall, induration around incision, minimal fluctuance noted, no surrounding erythema  Skin:    General: Skin is warm and dry.  Neurological:     General: No focal deficit present.     Mental Status: She is alert.  Psychiatric:        Behavior: Behavior is cooperative.     ED Results / Procedures / Treatments   Labs (all labs ordered are listed, but only abnormal results are displayed) Labs Reviewed - No data to display  EKG None  Radiology DG Chest Portable 1 View  Result Date: 03/06/2021 CLINICAL DATA:  Fever.  Right upper chest region abscess. EXAM: PORTABLE CHEST 1 VIEW COMPARISON:  None. FINDINGS: Heart and mediastinal shadows are normal. The left chest is clear. Hazy opacity projects over the medial right upper lobe. This could represent right upper lobe pneumonia or could be chest wall density projected over the area simulating pneumonia. Pulmonary vascularity is normal. No effusions. No significant bone finding. IMPRESSION: Hazy opacity projects over the medial right upper lobe. This could represent right upper lobe pneumonia or could be chest wall density projected over the area simulating pneumonia. Electronically Signed   By: Paulina Fusi M.D.   On: 03/06/2021 15:58    Procedures Procedures    Medications Ordered in ED Medications - No data to display  ED Course  I have reviewed the triage vital signs and the nursing notes.  Pertinent labs & imaging results that were available during my care of the patient were reviewed by me and considered in my medical decision making (see chart for details).    MDM Rules/Calculators/A&P                          Alert 35 year old female in no acute distress, nontoxic-appearing.  Patient presents for wound check of incision and drainage site to right chest.  Patient reports packing fell out  yesterday.  Patient had purulent drainage yesterday.  Patient denies any drainage today.  No worsening of swelling or erythema.  On physical exam surgical incision noted to right chest wall, surrounding induration and minimal swelling, no erythema, minimal fluctuance.  Patient advised to follow-up with her primary care provider next week.  Patient advised to seek treatment sooner if swelling or erythema increased.     Final Clinical Impression(s) / ED Diagnoses Final diagnoses:  Visit for wound check    Rx / DC Orders ED Discharge Orders    None       Berneice Heinrich 03/07/21 1402    Lorre Nick, MD 03/12/21 1108

## 2021-03-08 LAB — URINE CULTURE: Culture: <10000 — AB

## 2021-03-09 ENCOUNTER — Encounter (HOSPITAL_COMMUNITY): Payer: Self-pay | Admitting: Urology

## 2021-03-09 ENCOUNTER — Other Ambulatory Visit: Payer: Self-pay

## 2021-03-09 NOTE — Progress Notes (Signed)
COVID Vaccine Completed: Yes Date COVID Vaccine completed: x2 Has received booster: Yes COVID vaccine manufacturer: Pfizer      Date of COVID positive in last 90 days: No  PCP - Grayce Sessions, NP Cardiologist - N/A  Chest x-ray - 03/06/21 in epic EKG - 02/09/21  In epic Stress Test - N/A ECHO - N/A Cardiac Cath - N/A Pacemaker/ICD device last checked: N/A  Sleep Study - N/A CPAP - N/A  Fasting Blood Sugar - N/A Checks Blood Sugar __N/A___ times a day  Blood Thinner Instructions:N/A Aspirin Instructions:N/A Last Dose:N/A  Activity level:  Able to exercise without symptoms     Anesthesia review: N/A  Patient denies shortness of breath, fever, cough and chest pain at PAT appointment   Patient verbalized understanding of instructions that were given to them at the PAT appointment. Patient was also instructed that they will need to review over the PAT instructions again at home before surgery.

## 2021-03-10 ENCOUNTER — Encounter (HOSPITAL_COMMUNITY)
Admission: RE | Disposition: A | Discharge status: Home / Self Care | Payer: Self-pay | Source: Home / Self Care | Attending: Urology

## 2021-03-10 ENCOUNTER — Encounter (HOSPITAL_COMMUNITY): Payer: Self-pay | Admitting: Urology

## 2021-03-10 ENCOUNTER — Ambulatory Visit (HOSPITAL_COMMUNITY): Payer: 59 | Admitting: Physician Assistant

## 2021-03-10 ENCOUNTER — Ambulatory Visit (HOSPITAL_COMMUNITY)
Admission: RE | Admit: 2021-03-10 | Discharge: 2021-03-10 | Disposition: A | Discharge status: Home / Self Care | Payer: 59 | Attending: Urology | Admitting: Urology

## 2021-03-10 ENCOUNTER — Ambulatory Visit (HOSPITAL_COMMUNITY): Payer: 59

## 2021-03-10 DIAGNOSIS — Z809 Family history of malignant neoplasm, unspecified: Secondary | ICD-10-CM | POA: Insufficient documentation

## 2021-03-10 DIAGNOSIS — N132 Hydronephrosis with renal and ureteral calculous obstruction: Secondary | ICD-10-CM | POA: Diagnosis not present

## 2021-03-10 DIAGNOSIS — E282 Polycystic ovarian syndrome: Secondary | ICD-10-CM | POA: Insufficient documentation

## 2021-03-10 DIAGNOSIS — Z87442 Personal history of urinary calculi: Secondary | ICD-10-CM | POA: Insufficient documentation

## 2021-03-10 DIAGNOSIS — Q6210 Congenital occlusion of ureter, unspecified: Secondary | ICD-10-CM | POA: Diagnosis not present

## 2021-03-10 DIAGNOSIS — Z20822 Contact with and (suspected) exposure to covid-19: Secondary | ICD-10-CM | POA: Insufficient documentation

## 2021-03-10 DIAGNOSIS — N200 Calculus of kidney: Secondary | ICD-10-CM

## 2021-03-10 HISTORY — PX: CYSTOSCOPY/URETEROSCOPY/HOLMIUM LASER/STENT PLACEMENT: SHX6546

## 2021-03-10 HISTORY — DX: Cutaneous abscess, unspecified: L02.91

## 2021-03-10 LAB — CBC
HCT: 43.9 % (ref 36.0–46.0)
Hemoglobin: 14.3 g/dL (ref 12.0–15.0)
MCH: 30.2 pg (ref 26.0–34.0)
MCHC: 32.6 g/dL (ref 30.0–36.0)
MCV: 92.6 fL (ref 80.0–100.0)
Platelets: 286 10*3/uL (ref 150–400)
RBC: 4.74 MIL/uL (ref 3.87–5.11)
RDW: 12.1 % (ref 11.5–15.5)
WBC: 5.2 10*3/uL (ref 4.0–10.5)
nRBC: 0 % (ref 0.0–0.2)

## 2021-03-10 LAB — PREGNANCY, URINE: Preg Test, Ur: NEGATIVE

## 2021-03-10 LAB — SARS CORONAVIRUS 2 BY RT PCR (HOSPITAL ORDER, PERFORMED IN ~~LOC~~ HOSPITAL LAB): SARS Coronavirus 2: NEGATIVE

## 2021-03-10 SURGERY — CYSTOSCOPY/URETEROSCOPY/HOLMIUM LASER/STENT PLACEMENT
Anesthesia: General | Laterality: Left

## 2021-03-10 MED ORDER — CEFAZOLIN SODIUM-DEXTROSE 2-4 GM/100ML-% IV SOLN
2.0000 g | Freq: Once | INTRAVENOUS | Status: AC
  Administered 2021-03-10: 2 g via INTRAVENOUS
  Filled 2021-03-10: qty 100

## 2021-03-10 MED ORDER — MEPERIDINE HCL 50 MG/ML IJ SOLN
INTRAMUSCULAR | Status: AC
  Filled 2021-03-10: qty 1

## 2021-03-10 MED ORDER — LIDOCAINE 2% (20 MG/ML) 5 ML SYRINGE
INTRAMUSCULAR | Status: AC
  Filled 2021-03-10: qty 5

## 2021-03-10 MED ORDER — CEPHALEXIN 500 MG PO CAPS: 500.0000 mg | ORAL_CAPSULE | Freq: Every day | ORAL | 0 refills | Status: DC

## 2021-03-10 MED ORDER — FENTANYL CITRATE (PF) 100 MCG/2ML IJ SOLN
INTRAMUSCULAR | Status: AC
  Filled 2021-03-10: qty 2

## 2021-03-10 MED ORDER — ONDANSETRON HCL 4 MG/2ML IJ SOLN
INTRAMUSCULAR | Status: AC
  Filled 2021-03-10: qty 2

## 2021-03-10 MED ORDER — 0.9 % SODIUM CHLORIDE (POUR BTL) OPTIME
TOPICAL | Status: DC | PRN
  Administered 2021-03-10: 1000 mL

## 2021-03-10 MED ORDER — DEXAMETHASONE SODIUM PHOSPHATE 10 MG/ML IJ SOLN
INTRAMUSCULAR | Status: DC | PRN
  Administered 2021-03-10: 4 mg via INTRAVENOUS

## 2021-03-10 MED ORDER — FENTANYL CITRATE (PF) 100 MCG/2ML IJ SOLN
25.0000 ug | INTRAMUSCULAR | Status: DC | PRN
  Administered 2021-03-10: 25 ug via INTRAVENOUS

## 2021-03-10 MED ORDER — FENTANYL CITRATE (PF) 100 MCG/2ML IJ SOLN
INTRAMUSCULAR | Status: DC | PRN
  Administered 2021-03-10 (×2): 50 ug via INTRAVENOUS

## 2021-03-10 MED ORDER — CHLORHEXIDINE GLUCONATE 0.12 % MT SOLN
15.0000 mL | Freq: Once | OROMUCOSAL | Status: AC
  Administered 2021-03-10: 15 mL via OROMUCOSAL

## 2021-03-10 MED ORDER — ONDANSETRON HCL 4 MG/2ML IJ SOLN: 4.0000 mg | Freq: Once | INTRAMUSCULAR | Status: DC | PRN

## 2021-03-10 MED ORDER — PROPOFOL 10 MG/ML IV BOLUS
INTRAVENOUS | Status: DC | PRN
  Administered 2021-03-10: 200 mg via INTRAVENOUS

## 2021-03-10 MED ORDER — ORAL CARE MOUTH RINSE
15.0000 mL | Freq: Once | OROMUCOSAL | Status: AC
Start: 2021-03-10 — End: 2021-03-10

## 2021-03-10 MED ORDER — PROPOFOL 10 MG/ML IV BOLUS
INTRAVENOUS | Status: AC
  Filled 2021-03-10: qty 20

## 2021-03-10 MED ORDER — ACETAMINOPHEN 160 MG/5ML PO SOLN: 325.0000 mg | ORAL | Status: DC | PRN

## 2021-03-10 MED ORDER — LACTATED RINGERS IV SOLN: INTRAVENOUS | Status: DC

## 2021-03-10 MED ORDER — MEPERIDINE HCL 50 MG/ML IJ SOLN
6.2500 mg | INTRAMUSCULAR | Status: DC | PRN
  Administered 2021-03-10 (×2): 6.25 mg via INTRAVENOUS

## 2021-03-10 MED ORDER — OXYCODONE HCL 5 MG PO TABS: 5.0000 mg | ORAL_TABLET | Freq: Once | ORAL | Status: DC | PRN

## 2021-03-10 MED ORDER — ACETAMINOPHEN 325 MG PO TABS
325.0000 mg | ORAL_TABLET | ORAL | Status: DC | PRN
Start: 2021-03-10 — End: 2021-03-10

## 2021-03-10 MED ORDER — SODIUM CHLORIDE 0.9 % IR SOLN
Status: DC | PRN
  Administered 2021-03-10: 3000 mL

## 2021-03-10 MED ORDER — DEXAMETHASONE SODIUM PHOSPHATE 10 MG/ML IJ SOLN
INTRAMUSCULAR | Status: AC
  Filled 2021-03-10: qty 1

## 2021-03-10 MED ORDER — ONDANSETRON HCL 4 MG/2ML IJ SOLN
INTRAMUSCULAR | Status: DC | PRN
  Administered 2021-03-10: 4 mg via INTRAVENOUS

## 2021-03-10 MED ORDER — MIDAZOLAM HCL 2 MG/2ML IJ SOLN
INTRAMUSCULAR | Status: AC
  Filled 2021-03-10: qty 2

## 2021-03-10 MED ORDER — LIDOCAINE 2% (20 MG/ML) 5 ML SYRINGE
INTRAMUSCULAR | Status: DC | PRN
  Administered 2021-03-10: 100 mg via INTRAVENOUS

## 2021-03-10 MED ORDER — OXYCODONE HCL 5 MG/5ML PO SOLN: 5.0000 mg | Freq: Once | ORAL | Status: DC | PRN

## 2021-03-10 MED ORDER — MIDAZOLAM HCL 5 MG/5ML IJ SOLN
INTRAMUSCULAR | Status: DC | PRN
  Administered 2021-03-10: 2 mg via INTRAVENOUS

## 2021-03-10 SURGICAL SUPPLY — 22 items
BAG URO CATCHER STRL LF (MISCELLANEOUS) ×2 IMPLANT
BASKET ZERO TIP NITINOL 2.4FR (BASKET) IMPLANT
CATH INTERMIT  6FR 70CM (CATHETERS) ×2 IMPLANT
CATH URET 5FR 28IN CONE TIP (BALLOONS)
CATH URET 5FR 70CM CONE TIP (BALLOONS) IMPLANT
CLOTH BEACON ORANGE TIMEOUT ST (SAFETY) ×2 IMPLANT
EXTRACTOR STONE 1.7FRX115CM (UROLOGICAL SUPPLIES) ×2 IMPLANT
GLOVE SURG ENC MOIS LTX SZ7.5 (GLOVE) ×2 IMPLANT
GOWN STRL REUS W/TWL XL LVL3 (GOWN DISPOSABLE) ×2 IMPLANT
GUIDEWIRE STR DUAL SENSOR (WIRE) ×2 IMPLANT
GUIDEWIRE ZIPWRE .038 STRAIGHT (WIRE) ×2 IMPLANT
KIT TURNOVER KIT A (KITS) ×2 IMPLANT
LASER FIB FLEXIVA PULSE ID 365 (Laser) IMPLANT
MANIFOLD NEPTUNE II (INSTRUMENTS) ×2 IMPLANT
PACK CYSTO (CUSTOM PROCEDURE TRAY) ×2 IMPLANT
SHEATH URETERAL 12FRX28CM (UROLOGICAL SUPPLIES) ×2 IMPLANT
SHEATH URETERAL 12FRX35CM (MISCELLANEOUS) IMPLANT
STENT URET 6FRX26 CONTOUR (STENTS) ×2 IMPLANT
TRACTIP FLEXIVA PULS ID 200XHI (Laser) ×1 IMPLANT
TRACTIP FLEXIVA PULSE ID 200 (Laser) ×1
TUBING CONNECTING 10 (TUBING) ×2 IMPLANT
TUBING UROLOGY SET (TUBING) ×2 IMPLANT

## 2021-03-10 NOTE — Interval H&P Note (Signed)
History and Physical Interval Note:  03/10/2021 2:19 PM  Mindy Davies  has presented today for surgery, with the diagnosis of RENAL STONE, LEFT HYDRONEPHROSIS.  The various methods of treatment have been discussed with the patient and family. After consideration of risks, benefits and other options for treatment, the patient has consented to  Procedure(s) with comments: CYSTOSCOPY/URETEROSCOPY/HOLMIUM LASER/STENT EXCHANGE/ POSSIBLE URETERAL BIOPSY (Left) - 60 MIN as a surgical intervention.  The patient's history has been reviewed, patient examined, no change in status, stable for surgery.  I have reviewed the patient's chart and labs.  Questions were answered to the patient's satisfaction.  Pt s/p I&D of left chest abscess and remained on cephalexin BID. No fever or dysuria today. Discussed stones in the left kidney may or may not be significant but will try and clear any accessible stone and inspect the left ureter. Also discussed stent exchange and possible stent with string.    Jerilee Field

## 2021-03-10 NOTE — OR Nursing (Signed)
Stone taken by Dr. Eskridge ?

## 2021-03-10 NOTE — Anesthesia Preprocedure Evaluation (Addendum)
Anesthesia Evaluation  Patient identified by MRN, date of birth, ID band Patient awake    Reviewed: Allergy & Precautions, NPO status , Patient's Chart, lab work & pertinent test results  Airway Mallampati: I  TM Distance: >3 FB Neck ROM: Full    Dental no notable dental hx. (+) Teeth Intact, Dental Advisory Given   Pulmonary neg pulmonary ROS,    Pulmonary exam normal breath sounds clear to auscultation       Cardiovascular negative cardio ROS Normal cardiovascular exam Rhythm:Regular Rate:Normal     Neuro/Psych negative neurological ROS  negative psych ROS   GI/Hepatic negative GI ROS, Neg liver ROS,   Endo/Other  Morbid obesity  Renal/GU Renal diseaseBilateral renal stones, Left ureteral stone  negative genitourinary   Musculoskeletal negative musculoskeletal ROS (+)   Abdominal (+) + obese,   Peds  Hematology negative hematology ROS (+)   Anesthesia Other Findings   Reproductive/Obstetrics                            Anesthesia Physical  Anesthesia Plan  ASA: II and emergent  Anesthesia Plan: General   Post-op Pain Management:    Induction: Intravenous  PONV Risk Score and Plan: 3 and Midazolam, Dexamethasone, Ondansetron and Treatment may vary due to age or medical condition  Airway Management Planned: LMA  Additional Equipment:   Intra-op Plan:   Post-operative Plan: Extubation in OR  Informed Consent: I have reviewed the patients History and Physical, chart, labs and discussed the procedure including the risks, benefits and alternatives for the proposed anesthesia with the patient or authorized representative who has indicated his/her understanding and acceptance.     Dental advisory given  Plan Discussed with: Anesthesiologist, CRNA and Surgeon  Anesthesia Plan Comments:        Anesthesia Quick Evaluation

## 2021-03-10 NOTE — Discharge Instructions (Signed)
Ureteral Stent Implantation, Care After This sheet gives you information about how to care for yourself after your procedure. Your health care provider may also give you more specific instructions. If you have problems or questions, contact your health care provider.     What can I expect after the procedure? After the procedure, it is common to have:  Nausea.  Mild pain when you urinate. You may feel this pain in your lower back or lower abdomen. The pain should stop within a few minutes after you urinate. This may last for up to 1 week.  A small amount of blood in your urine for several days. Follow these instructions at home: Medicines  Take over-the-counter and prescription medicines only as told by your health care provider.  If you were prescribed an antibiotic medicine, take it as told by your health care provider. Do not stop taking the antibiotic even if you start to feel better.  Do not drive for 24 hours if you were given a sedative during your procedure.  Ask your health care provider if the medicine prescribed to you requires you to avoid driving or using heavy machinery. Activity  Rest as told by your health care provider.  Avoid sitting for a long time without moving. Get up to take short walks every 1-2 hours. This is important to improve blood flow and breathing. Ask for help if you feel weak or unsteady.  Return to your normal activities as told by your health care provider. Ask your health care provider what activities are safe for you. General instructions  Watch for any blood in your urine. Call your health care provider if the amount of blood in your urine increases.  If you have a catheter: ? Follow instructions from your health care provider about taking care of your catheter and collection bag. ? Do not take baths, swim, or use a hot tub until your health care provider approves. Ask your health care provider if you may take showers. You may only be  allowed to take sponge baths.  Drink enough fluid to keep your urine pale yellow.  Do not use any products that contain nicotine or tobacco, such as cigarettes, e-cigarettes, and chewing tobacco. These can delay healing after surgery. If you need help quitting, ask your health care provider.  Keep all follow-up visits as told by your health care provider. This is important.   Contact a health care provider if:  You have pain that gets worse or does not get better with medicine, especially pain when you urinate.  You have difficulty urinating.  You feel nauseous or you vomit repeatedly during a period of more than 2 days after the procedure. Get help right away if:  Your urine is dark red or has blood clots in it.  You are leaking urine (have incontinence).  The end of the stent comes out of your urethra.  You cannot urinate.  You have sudden, sharp, or severe pain in your abdomen or lower back.  You have a fever.  You have swelling or pain in your legs.  You have difficulty breathing. Summary  After the procedure, it is common to have mild pain when you urinate that goes away within a few minutes after you urinate. This may last for up to 1 week.  Watch for any blood in your urine. Call your health care provider if the amount of blood in your urine increases.  Take over-the-counter and prescription medicines only as told by your  health care provider.  Drink enough fluid to keep your urine pale yellow. This information is not intended to replace advice given to you by your health care provider. Make sure you discuss any questions you have with your health care provider. Document Revised: 08/22/2018 Document Reviewed: 08/23/2018 Elsevier Patient Education  2021 [employer] Inc[employer]   PULL STRING ON Monday!!!

## 2021-03-10 NOTE — Anesthesia Procedure Notes (Signed)
Procedure Name: LMA Insertion °Performed by: Anquan Azzarello H, CRNA °Pre-anesthesia Checklist: Patient identified, Emergency Drugs available, Suction available and Patient being monitored °Patient Re-evaluated:Patient Re-evaluated prior to induction °Oxygen Delivery Method: Circle System Utilized °Preoxygenation: Pre-oxygenation with 100% oxygen °Induction Type: IV induction °Ventilation: Mask ventilation without difficulty °LMA: LMA inserted °LMA Size: 4.0 °Number of attempts: 1 °Airway Equipment and Method: Bite block °Placement Confirmation: positive ETCO2 °Tube secured with: Tape °Dental Injury: Teeth and Oropharynx as per pre-operative assessment  ° ° ° ° ° ° °

## 2021-03-10 NOTE — Anesthesia Postprocedure Evaluation (Signed)
Anesthesia Post Note  Patient: Mindy Davies  Procedure(s) Performed: CYSTOSCOPY/URETEROSCOPY/HOLMIUM LASER/STENT EXCHANGE (Left )     Patient location during evaluation: PACU Anesthesia Type: General Level of consciousness: awake and alert Pain management: pain level controlled Vital Signs Assessment: post-procedure vital signs reviewed and stable Respiratory status: spontaneous breathing, nonlabored ventilation, respiratory function stable and patient connected to nasal cannula oxygen Cardiovascular status: blood pressure returned to baseline and stable Postop Assessment: no apparent nausea or vomiting Anesthetic complications: no   No complications documented.  Last Vitals:  Vitals:   03/10/21 1707 03/10/21 1728  BP: 110/76 110/75  Pulse: 97 66  Resp: 16 16  Temp: 36.4 C 36.4 C  SpO2: 96% 98%    Last Pain:  Vitals:   03/10/21 1728  TempSrc:   PainSc: 0-No pain                 Tericka Devincenzi

## 2021-03-10 NOTE — Op Note (Signed)
Preoperative diagnosis: Left renal stones, ureteral narrowing Postoperative diagnosis: Left renal stones   Procedure: Cystoscopy with left diagnostic ureteroscopy, left stone basket extraction, holmium laser lithotripsy and stent exchange  Surgeon: Mena Goes  Anesthesia: General  Indication for procedure: Mindy Davies is a 35 year old female with a history of stones.  She presented with left flank pain and a febrile UTI with E. coli and CT scan was done which showed left hydronephrosis, small stones in the left kidney but no ureteral stone.  She underwent an urgent stent because the hydronephrosis persisted on follow-up ultrasound.  Surgeon placing the left stent noted, "The left retrograde pyelogram demonstrated some slightly irregular narrowing of the distal 2 cm of ureter and then fusiform dilation of the mid and distal proximal ureter.  But there was a kink in the ureter at about the junction of the proximal and middle third of the ureter and above that the ureter was quite narrow leading into a tapered UPJ with moderate hydronephrosis."  Therefore she was brought today to primarily inspect the ureter to make sure there was nothing worrisome such as a stricture or neoplastic process and also to remove any stones that we could from the renal calyces.  Findings: On cystourethroscopy the urethra was unremarkable, bladder without stone or mucosal lesion.  Left ureteral stent had already encrusted on the coil.  Left diagnostic ureteroscopy noted some inflammation of the known distal and mid ureter consistent with prior stent, no stricture or transition points and a quite smooth normal-appearing proximal ureter with a slight turn into the UPJ which was of normal patency.  There were small 2 to 3 mm stones forming along the renal papilla as suspected.  A few of these were removed but many were too small.  I took the 243 m laser fiber and dusted many of the small stones off the surface and washed them out.  No  significant stone remained.  Description of procedure: After consent was obtained patient brought to the operating room.  After adequate anesthesia she is placed lithotomy position and prepped and draped in the usual sterile fashion.  A timeout was performed to confirm the patient and procedure.  Cystoscope was passed per urethra and the bladder inspected.  Left ureteral stent appeared encrusted therefore I went ahead and placed a straight zip wire along the stent and coiled that in the collecting system.  The cystoscope was then repassed and the stent grasped and removed without any difficulty.  Semirigid ureteroscope was then advanced into the left ureter and it inspected all the way up to the proximal ureter.  When I got escorts I could with the semirigid I placed a second wire under direct vision and backed the semirigid out and then placed a short access sheath without difficulty.  A dual channel digital ureteroscope was then advanced in the proximal ureter inspected with no abnormal findings.  Collecting system was then inspected and noted there to be several small stones forming.  A couple of the stones were then removed with an engage basket.  Many of the particles were small and adherent to the mucosa.  I took a 243 m laser fiber and dusted some of the upper middle and lower pole stones and stone fragments off the renal papilla.  Setting was 0.2 and 70 very low energy setting did not damage to the mucosa.  No other significant stones were noted on final inspection of the collecting system.  The access sheath was backed out on the ureteroscope and  again the collecting system renal pelvis and ureter inspected noted there to be no abnormal findings.  The wire was backloaded on the cystoscope and a 6 x 26 cm stent advanced.  The wire was removed with a good coil seen in the renal pelvis and a good coil in the bladder.  The bladder was drained and the scope removed.  I left a string on the stent.  She was  awakened taken recovery room in stable condition.  Complications: None  Blood loss: Minimal  Specimens: Stone fragments to office lab  Drains: 6 x 26 cm left ureteral stent with string  Disposition: Patient stable to PACU

## 2021-03-10 NOTE — Transfer of Care (Signed)
Immediate Anesthesia Transfer of Care Note  Patient: Mindy Davies  Procedure(s) Performed: CYSTOSCOPY/URETEROSCOPY/HOLMIUM LASER/STENT EXCHANGE (Left )  Patient Location: PACU  Anesthesia Type:General  Level of Consciousness: drowsy  Airway & Oxygen Therapy: Patient Spontanous Breathing and Patient connected to face mask oxygen  Post-op Assessment: Report given to RN and Post -op Vital signs reviewed and stable  Post vital signs: Reviewed and stable  Last Vitals:  Vitals Value Taken Time  BP 123/91 03/10/21 1601  Temp 36.7 C 03/10/21 1601  Pulse 79 03/10/21 1603  Resp 21 03/10/21 1603  SpO2 100 % 03/10/21 1603  Vitals shown include unvalidated device data.  Last Pain:  Vitals:   03/10/21 1332  TempSrc:   PainSc: 0-No pain      Patients Stated Pain Goal: 3 (03/09/21 0932)  Complications: No complications documented.

## 2021-03-11 ENCOUNTER — Encounter (HOSPITAL_COMMUNITY): Payer: Self-pay | Admitting: Urology

## 2021-03-19 ENCOUNTER — Ambulatory Visit (INDEPENDENT_AMBULATORY_CARE_PROVIDER_SITE_OTHER): Payer: 59 | Admitting: Primary Care

## 2021-03-19 ENCOUNTER — Encounter (INDEPENDENT_AMBULATORY_CARE_PROVIDER_SITE_OTHER): Payer: Self-pay | Admitting: Primary Care

## 2021-03-19 ENCOUNTER — Other Ambulatory Visit: Payer: Self-pay

## 2021-03-19 VITALS — BP 108/72 | HR 69 | Temp 97.3°F | Ht 67.0 in | Wt 261.6 lb

## 2021-03-19 DIAGNOSIS — Z09 Encounter for follow-up examination after completed treatment for conditions other than malignant neoplasm: Secondary | ICD-10-CM | POA: Diagnosis not present

## 2021-03-19 DIAGNOSIS — L723 Sebaceous cyst: Secondary | ICD-10-CM | POA: Diagnosis not present

## 2021-03-19 NOTE — Progress Notes (Signed)
[facility]  Chief complaint Hospital follow-up  HPI  Ms. Mindy Davies is a 35 y.o.female presents for follow up from the hospital. Admit date to the hospital was 03/10/21, patient was discharged from the hospital on 03/10/21, patient was admitted for: Procedure for kidney stones..  Follow up appointment already scheduled with Jerilee Field, MD (Urology) on 04/01/2021; at 10:45 AM for [place] and OV.   Past Medical History:  Diagnosis Date  . Abscess    Upper right chest  . COVID 2020   asymptomatic  . History of kidney stones   . PCOS (polycystic ovarian syndrome)   . Renal calculi    bilateral  . Wears contact lenses      No Known Allergies    Current Outpatient Medications on File Prior to Visit  Medication Sig Dispense Refill  . cephALEXin (KEFLEX) 500 MG capsule Take 1 capsule (500 mg total) by mouth at bedtime. 7 capsule 0   No current facility-administered medications on file prior to visit.    ROS: all negative except above.   Physical Exam: Filed Weights   03/19/21 1507  Weight: 261 lb 9.6 oz (118.7 kg)   BP 108/72 (BP Location: Right Arm, Patient Position: Sitting, Cuff Size: Large)   Pulse 69   Temp (!) 97.3 F (36.3 C) (Temporal)   Ht 5\' 7"  (1.702 m)   Wt 261 lb 9.6 oz (118.7 kg)   LMP 03/09/2021 (Exact Date)   SpO2 95%   BMI 40.97 kg/m  General Appearance: Well nourished, morbidly obese female in no apparent distress. Eyes: PERRLA, EOMs, conjunctiva no swelling or erythema Sinuses: No Frontal/maxillary tenderness ENT/Mouth: Ext aud canals clear, TMs without erythema, bulging. 05/09/2021 Hearing normal.  Neck: Supple, thyroid normal.  Respiratory: Respiratory effort normal, BS equal bilaterally without rales, rhonchi, wheezing or stridor.  Cardio: RRR with no MRGs. Brisk peripheral pulses without edema.  Abdomen: Soft, + BS.  Diminished  tender,  Lymphatics: Non tender without lymphadenopathy.  Musculoskeletal: Full ROM, 5/5 strength,  normal gait.  Skin: Warm, dry without rashes, healing lesions, from  I&D  Neuro: Cranial nerves intact. Normal muscle tone, no cerebellar symptoms. Sensation intact.  Psych: Awake and oriented X 3, normal affect, Insight and Judgment appropriate.    Aura was seen today for follow-up.  Diagnoses and all orders for this visit:  Hospital discharge follow-up Outpatient procedure for kidney stones follow-up already scheduled with urologist.  Patient is doing well at this time.  Sebaceous cyst Drained in the ER I&D placed on antibiotics.  Healing.  No signs and symptoms of infection.  Discussed to monitor for increased pain, change in color redness warmth, odor or fever.  Return to ER immediately.  Marland Kitchen, NP 3:19 PM

## 2021-03-27 ENCOUNTER — Ambulatory Visit (INDEPENDENT_AMBULATORY_CARE_PROVIDER_SITE_OTHER): Payer: 59 | Admitting: Primary Care

## 2021-03-27 ENCOUNTER — Encounter (INDEPENDENT_AMBULATORY_CARE_PROVIDER_SITE_OTHER): Payer: Self-pay | Admitting: Primary Care

## 2021-03-27 ENCOUNTER — Other Ambulatory Visit: Payer: Self-pay

## 2021-03-27 ENCOUNTER — Other Ambulatory Visit (HOSPITAL_COMMUNITY)
Admission: RE | Admit: 2021-03-27 | Discharge: 2021-03-27 | Disposition: A | Discharge status: Home / Self Care | Payer: 59 | Source: Ambulatory Visit | Attending: Primary Care | Admitting: Primary Care

## 2021-03-27 VITALS — BP 118/78 | HR 61 | Resp 16 | Wt 264.6 lb

## 2021-03-27 DIAGNOSIS — Z124 Encounter for screening for malignant neoplasm of cervix: Secondary | ICD-10-CM

## 2021-03-27 DIAGNOSIS — Z01419 Encounter for gynecological examination (general) (routine) without abnormal findings: Secondary | ICD-10-CM | POA: Diagnosis not present

## 2021-03-27 DIAGNOSIS — A64 Unspecified sexually transmitted disease: Secondary | ICD-10-CM | POA: Diagnosis not present

## 2021-03-27 NOTE — Patient Instructions (Addendum)
Contraception Choices Contraception, also called birth control, refers to methods or devices that prevent pregnancy. Hormonal methods Contraceptive implant A contraceptive implant is a thin, plastic tube that contains a hormone that prevents pregnancy. It is different from an intrauterine device (IUD). It is inserted into the upper part of the arm by a health care provider. Implants can be effective for up to 3 years. Progestin-only injections Progestin-only injections are injections of progestin, a synthetic form of the hormone progesterone. They are given every 3 months by a health care provider. Birth control pills Birth control pills are pills that contain hormones that prevent pregnancy. They must be taken once a day, preferably at the same time each day. A prescription is needed to use this method of contraception. Birth control patch The birth control patch contains hormones that prevent pregnancy. It is placed on the skin and must be changed once a week for three weeks and removed on the fourth week. A prescription is needed to use this method of contraception. Vaginal ring A vaginal ring contains hormones that prevent pregnancy. It is placed in the vagina for three weeks and removed on the fourth week. After that, the process is repeated with a new ring. A prescription is needed to use this method of contraception. Emergency contraceptive Emergency contraceptives prevent pregnancy after unprotected sex. They come in pill form and can be taken up to 5 days after sex. They work best the sooner they are taken after having sex. Most emergency contraceptives are available without a prescription. This method should not be used as your only form of birth control.   Barrier methods Female condom A female condom is a thin sheath that is worn over the penis during sex. Condoms keep sperm from going inside a woman's body. They can be used with a sperm-killing substance (spermicide) to increase their  effectiveness. They should be thrown away after one use. Female condom A female condom is a soft, loose-fitting sheath that is put into the vagina before sex. The condom keeps sperm from going inside a woman's body. They should be thrown away after one use. Diaphragm A diaphragm is a soft, dome-shaped barrier. It is inserted into the vagina before sex, along with a spermicide. The diaphragm blocks sperm from entering the uterus, and the spermicide kills sperm. A diaphragm should be left in the vagina for 6-8 hours after sex and removed within 24 hours. A diaphragm is prescribed and fitted by a health care provider. A diaphragm should be replaced every 1-2 years, after giving birth, after gaining more than 15 lb (6.8 kg), and after pelvic surgery. Cervical cap A cervical cap is a round, soft latex or plastic cup that fits over the cervix. It is inserted into the vagina before sex, along with spermicide. It blocks sperm from entering the uterus. The cap should be left in place for 6-8 hours after sex and removed within 48 hours. A cervical cap must be prescribed and fitted by a health care provider. It should be replaced every 2 years. Sponge A sponge is a soft, circular piece of polyurethane foam with spermicide in it. The sponge helps block sperm from entering the uterus, and the spermicide kills sperm. To use it, you make it wet and then insert it into the vagina. It should be inserted before sex, left in for at least 6 hours after sex, and removed and thrown away within 30 hours. Spermicides Spermicides are chemicals that kill or block sperm from entering the  cervix and uterus. They can come as a cream, jelly, suppository, foam, or tablet. A spermicide should be inserted into the vagina with an applicator at least 25-63 minutes before sex to allow time for it to work. The process must be repeated every time you have sex. Spermicides do not require a prescription.   Intrauterine  contraception Intrauterine device (IUD) An IUD is a T-shaped device that is put in a woman's uterus. There are two types:  Hormone IUD.This type contains progestin, a synthetic form of the hormone progesterone. This type can stay in place for 3-5 years.  Copper IUD.This type is wrapped in copper wire. It can stay in place for 10 years. Permanent methods of contraception Female tubal ligation In this method, a woman's fallopian tubes are sealed, tied, or blocked during surgery to prevent eggs from traveling to the uterus. Hysteroscopic sterilization In this method, a small, flexible insert is placed into each fallopian tube. The inserts cause scar tissue to form in the fallopian tubes and block them, so sperm cannot reach an egg. The procedure takes about 3 months to be effective. Another form of birth control must be used during those 3 months. Female sterilization This is a procedure to tie off the tubes that carry sperm (vasectomy). After the procedure, the man can still ejaculate fluid (semen). Another form of birth control must be used for 3 months after the procedure. Natural planning methods Natural family planning In this method, a couple does not have sex on days when the woman could become pregnant. Calendar method In this method, the woman keeps track of the length of each menstrual cycle, identifies the days when pregnancy can happen, and does not have sex on those days. Ovulation method In this method, a couple avoids sex during ovulation. Symptothermal method This method involves not having sex during ovulation. The woman typically checks for ovulation by watching changes in her temperature and in the consistency of cervical mucus. Post-ovulation method In this method, a couple waits to have sex until after ovulation. Where to find more information  Centers for Disease Control and Prevention: http://www.wolf.info/ Summary  Contraception, also called birth control, refers to methods or  devices that prevent pregnancy.  Hormonal methods of contraception include implants, injections, pills, patches, vaginal rings, and emergency contraceptives.  Barrier methods of contraception can include female condoms, female condoms, diaphragms, cervical caps, sponges, and spermicides.  There are two types of IUDs (intrauterine devices). An IUD can be put in a woman's uterus to prevent pregnancy for 3-5 years.  Permanent sterilization can be done through a procedure for males and females. Natural family planning methods involve nothaving sex on days when the woman could become pregnant. This information is not intended to replace advice given to you by your health care provider. Make sure you discuss any questions you have with your health care provider. Document Revised: 04/21/2020 Document Reviewed: 04/21/2020 Elsevier Patient Education  [place] 35-29 Years Old, Female Preventive care refers to lifestyle choices and visits with your health care provider that can promote health and wellness. This includes:  A yearly physical exam. This is also called an annual wellness visit.  Regular dental and eye exams.  Immunizations.  Screening for certain conditions.  Healthy lifestyle choices, such as: ? Eating a healthy diet. ? Getting regular exercise. ? Not using drugs or products that contain nicotine and tobacco. ? Limiting alcohol use. What can I expect for my preventive care visit? Physical  exam Your health care provider may check your:  Height and weight. These may be used to calculate your BMI (body mass index). BMI is a measurement that tells if you are at a healthy weight.  Heart rate and blood pressure.  Body temperature.  Skin for abnormal spots. Counseling Your health care provider may ask you questions about your:  Past medical problems.  Family's medical history.  Alcohol, tobacco, and drug use.  Emotional well-being.  Home life  and relationship well-being.  Sexual activity.  Diet, exercise, and sleep habits.  Work and work environment.  Access to firearms.  Method of birth control.  Menstrual cycle.  Pregnancy history. What immunizations do I need? Vaccines are usually given at various ages, according to a schedule. Your health care provider will recommend vaccines for you based on your age, medical history, and lifestyle or other factors, such as travel or where you work.   What tests do I need? Blood tests  Lipid and cholesterol levels. These may be checked every 5 years starting at age 20.  Hepatitis C test.  Hepatitis B test. Screening  Diabetes screening. This is done by checking your blood sugar (glucose) after you have not eaten for a while (fasting).  STD (sexually transmitted disease) testing, if you are at risk.  BRCA-related cancer screening. This may be done if you have a family history of breast, ovarian, tubal, or peritoneal cancers.  Pelvic exam and Pap test. This may be done every 3 years starting at age 21. Starting at age 30, this may be done every 5 years if you have a Pap test in combination with an HPV test. Talk with your health care provider about your test results, treatment options, and if necessary, the need for more tests.   Follow these instructions at home: Eating and drinking  Eat a healthy diet that includes fresh fruits and vegetables, whole grains, lean protein, and low-fat dairy products.  Take vitamin and mineral supplements as recommended by your health care provider.  Do not drink alcohol if: ? Your health care provider tells you not to drink. ? You are pregnant, may be pregnant, or are planning to become pregnant.  If you drink alcohol: ? Limit how much you have to 0-1 drink a day. ? Be aware of how much alcohol is in your drink. In the [place] one drink equals one 12 oz bottle of beer (355 mL), one 5 oz glass of wine (148 mL), or one 1 oz glass of hard  liquor (44 mL).   Lifestyle  Take daily care of your teeth and gums. Brush your teeth every morning and night with fluoride toothpaste. Floss one time each day.  Stay active. Exercise for at least 30 minutes 5 or more days each week.  Do not use any products that contain nicotine or tobacco, such as cigarettes, e-cigarettes, and chewing tobacco. If you need help quitting, ask your health care provider.  Do not use drugs.  If you are sexually active, practice safe sex. Use a condom or other form of protection to prevent STIs (sexually transmitted infections).  If you do not wish to become pregnant, use a form of birth control. If you plan to become pregnant, see your health care provider for a prepregnancy visit.  Find healthy ways to cope with stress, such as: ? Meditation, yoga, or listening to music. ? Journaling. ? Talking to a trusted person. ? Spending time with friends and family. Safety  Always wear your   seat belt while driving or riding in a vehicle.  Do not drive: ? If you have been drinking alcohol. Do not ride with someone who has been drinking. ? When you are tired or distracted. ? While texting.  Wear a helmet and other protective equipment during sports activities.  If you have firearms in your house, make sure you follow all gun safety procedures.  Seek help if you have been physically or sexually abused. What's next?  Go to your health care provider once a year for an annual wellness visit.  Ask your health care provider how often you should have your eyes and teeth checked.  Stay up to date on all vaccines. This information is not intended to replace advice given to you by your health care provider. Make sure you discuss any questions you have with your health care provider. Document Revised: 07/13/2020 Document Reviewed: 07/27/2018 Elsevier Patient Education  2021 [employer].

## 2021-03-27 NOTE — Progress Notes (Signed)
  Subjective:      Mindy Davies is a 35 y.o. female and is here for a gyn visit . The patient reports no problems.  Social History   Socioeconomic History  . Marital status: Single    Spouse name: Not on file  . Number of children: Not on file  . Years of education: Not on file  . Highest education level: Not on file  Occupational History  . Not on file  Tobacco Use  . Smoking status: Never Smoker  . Smokeless tobacco: Never Used  Vaping Use  . Vaping Use: Never used  Substance and Sexual Activity  . Alcohol use: Yes    Alcohol/week: 1.0 standard drink    Types: 1 Glasses of wine per week    Comment: daily  . Drug use: No  . Sexual activity: Yes    Birth control/protection: None  Other Topics Concern  . Not on file  Social History Narrative  . Not on file   Social Determinants of Health   Financial Resource Strain: Not on file  Food Insecurity: Not on file  Transportation Needs: Not on file  Physical Activity: Not on file  Stress: Not on file  Social Connections: Not on file  Intimate Partner Violence: Not on file   Health Maintenance  Topic Date Due  . Hepatitis C Screening  Never done  . COVID-19 Vaccine (1) Never done  . PAP SMEAR-Modifier  12/24/2017  . INFLUENZA VACCINE  06/29/2021  . TETANUS/TDAP  12/24/2024  . HIV Screening  Completed  . HPV VACCINES  Aged Out    The following portions of the patient's history were reviewed and updated as appropriate: allergies, current medications, past family history and problem list.  Review of Systems A comprehensive review of systems was negative.   Objective:   .BP 118/78   Pulse 61   Resp 16   Wt 264 lb 9.6 oz (120 kg)   LMP 03/09/2021 (Exact Date)   SpO2 96%   BMI 41.44 kg/m   Assessment:  CONSTITUTIONAL: Well-developed, well-nourished morbid obese  female in no acute distress.  HENT:  Normocephalic, atraumatic, External right and left ear normal. Oropharynx is clear and moist EYES:  Conjunctivae and EOM are normal. Pupils are equal, round, and reactive to light. No scleral icterus.  NECK: Normal range of motion, supple, no masses.  Normal thyroid.  SKIN: Skin is warm and dry. No rash noted. Not diaphoretic. No erythema. No pallor. NEUROLGIC: Alert and oriented to person, place, and time. Normal reflexes, muscle tone coordination. No cranial nerve deficit noted. PSYCHIATRIC: Normal mood and affect. Normal behavior. Normal judgment and thought content. CARDIOVASCULAR: Normal heart rate noted, regular rhythm RESPIRATORY: Clear to auscultation bilaterally. Effort and breath sounds normal, no problems with respiration noted. BREASTS: Symmetric in size. No masses, skin changes, nipple drainage, or lymphadenopathy. Taught SBE and return demonstration   ABDOMEN: Soft, normal bowel sounds, no distention noted.  No tenderness, rebound or guarding.  PELVIC: Normal appearing external genitalia; normal appearing vaginal mucosa and cervix.  No abnormal discharge noted.  Pap smear obtained.  Normal uterine size, no other palpable masses, no uterine or adnexal tenderness. MUSCULOSKELETAL: Normal range of motion. No tenderness.  No cyanosis, clubbing, or edema.  2+ distal pulses.  Plan:  Return for Pap in 3 years   See After Visit Summary for Counseling Recommendations

## 2021-03-30 LAB — CERVICOVAGINAL ANCILLARY ONLY
Bacterial Vaginitis (gardnerella): NEGATIVE
Candida Glabrata: NEGATIVE
Candida Vaginitis: NEGATIVE
Chlamydia: NEGATIVE
Comment: NEGATIVE
Comment: NEGATIVE
Comment: NEGATIVE
Comment: NEGATIVE
Comment: NEGATIVE
Comment: NORMAL
Neisseria Gonorrhea: NEGATIVE
Trichomonas: NEGATIVE

## 2021-03-31 LAB — CYTOLOGY - PAP

## 2021-08-31 ENCOUNTER — Encounter (INDEPENDENT_AMBULATORY_CARE_PROVIDER_SITE_OTHER): Payer: Self-pay | Admitting: Primary Care

## 2021-08-31 ENCOUNTER — Ambulatory Visit (INDEPENDENT_AMBULATORY_CARE_PROVIDER_SITE_OTHER): Payer: 59 | Admitting: Primary Care

## 2021-08-31 ENCOUNTER — Other Ambulatory Visit: Payer: Self-pay

## 2021-08-31 VITALS — BP 101/67 | HR 57 | Temp 97.7°F | Ht 67.0 in | Wt 282.8 lb

## 2021-08-31 DIAGNOSIS — N926 Irregular menstruation, unspecified: Secondary | ICD-10-CM | POA: Diagnosis not present

## 2021-08-31 DIAGNOSIS — Z7689 Persons encountering health services in other specified circumstances: Secondary | ICD-10-CM

## 2021-08-31 NOTE — Patient Instructions (Signed)
Phentermine Capsules or Tablets What is this medication? PHENTERMINE (FEN ter meen) promotes weight loss. It works by decreasing appetite. It is often used for a short period of time. Changes to diet and exercise are often combined with this medication. This medicine may be used for other purposes; ask your health care provider or pharmacist if you have questions. COMMON BRAND NAME(S): Adipex-P, Atti-Plex P, Atti-Plex P Spansule, Fastin, Lomaira, Pro-Fast, Tara-8 What should I tell my care team before I take this medication? They need to know if you have any of these conditions: Agitation or nervousness Diabetes Glaucoma Heart disease High blood pressure History of drug abuse or addiction History of stroke Kidney disease Lung disease called Primary Pulmonary Hypertension (PPH) Taken an MAOI like Carbex, Eldepryl, Marplan, Nardil, or Parnate in last 14 days Taking stimulant medications for attention disorders, weight loss, or to stay awake Thyroid disease An unusual or allergic reaction to phentermine, other medications, foods, dyes, or preservatives Pregnant or trying to get pregnant Breast-feeding How should I use this medication? Take this medication by mouth with a glass of water. Follow the directions on the prescription label. Take your medication at regular intervals. Do not take it more often than directed. Do not stop taking except on your care team's advice. Talk to your care team about the use of this medication in children. While this medication may be prescribed for children 17 years or older for selected conditions, precautions do apply. Overdosage: If you think you have taken too much of this medicine contact a poison control center or emergency room at once. NOTE: This medicine is only for you. Do not share this medicine with others. What if I miss a dose? If you miss a dose, take it as soon as you can. If it is almost time for your next dose, take only that dose. Do not  take double or extra doses. What may interact with this medication? Do not take this medication with any of the following: MAOIs like Carbex, Eldepryl, Marplan, Nardil, and Parnate This medication may also interact with the following: Alcohol Certain medications for depression, anxiety, or psychotic disorders Certain medications for high blood pressure Linezolid Medications for colds or breathing difficulties like pseudoephedrine or phenylephrine Medications for diabetes Sibutramine Stimulant medications for attention disorders, weight loss, or to stay awake This list may not describe all possible interactions. Give your health care provider a list of all the medicines, herbs, non-prescription drugs, or dietary supplements you use. Also tell them if you smoke, drink alcohol, or use illegal drugs. Some items may interact with your medicine. What should I watch for while using this medication? Visit your care team for regular checks on your progress. Do not stop taking except on your care team's advice. You may develop a severe reaction. Your care team will tell you how much medication to take. Do not take this medication close to bedtime. It may prevent you from sleeping. You may get drowsy or dizzy. Do not drive, use machinery, or do anything that needs mental alertness until you know how this medication affects you. Do not stand or sit up quickly, especially if you are an older patient. This reduces the risk of dizzy or fainting spells. Alcohol may increase dizziness and drowsiness. Avoid alcoholic drinks. This medication may affect blood sugar levels. Ask your care team if changes in diet or medications are needed if you have diabetes. Women should inform their care team if they wish to become pregnant or think they   might be pregnant. Losing weight while pregnant is not advised and may cause harm to the unborn child. Talk to your care team for more information. What side effects may I notice  from receiving this medication? Side effects that you should report to your care team as soon as possible: Allergic reactions-skin rash, itching, hives, swelling of the face, lips, tongue, or throat Heart failure-shortness of breath, swelling of the ankles, feet, or hands, sudden weight gain, unusual weakness or fatigue Pulmonary hypertension-shortness of breath, chest pain, fast or irregular heartbeat, feeling faint or lightheaded, fatigue, swelling of the ankles or feet Side effects that usually do not require medical attention (report to your care team if they continue or are bothersome): Change in taste Diarrhea Dizziness Dry mouth Restlessness Trouble sleeping This list may not describe all possible side effects. Call your doctor for medical advice about side effects. You may report side effects to FDA at 1-800-FDA-1088. Where should I keep my medication? Keep out of the reach of children. This medication can be abused. Keep your medication in a safe place to protect it from theft. Do not share this medication with anyone. Selling or giving away this medication is dangerous and against the law. This medication may cause harm and death if it is taken by other adults, children, or pets. Return medication that has not been used to an official disposal site. Contact the DEA at 1-800-882-9539 or your city/county government to find a site. If you cannot return the medication, mix any unused medication with a substance like cat litter or coffee grounds. Then throw the medication away in a sealed container like a sealed bag or coffee can with a lid. Do not use the medication after the expiration date. Store at room temperature between 20 and 25 degrees C (68 and 77 degrees F). Keep container tightly closed. NOTE: This sheet is a summary. It may not cover all possible information. If you have questions about this medicine, talk to your doctor, pharmacist, or health care provider.  2022 Elsevier/Gold  Standard (2021-02-22 20:04:50)  

## 2021-08-31 NOTE — Progress Notes (Signed)
   Mindy Davies, is a 35 y.o. female  GEX:[id number]  WNU:[id number]  DOB - 1986-03-01  Chief Complaint  Patient presents with   Follow-up       Subjective:   Mindy Davies is a 35 y.o. female here today for a follow up visit weight management . Patient has No headache, No chest pain, No abdominal pain - No Nausea, No new weakness tingling or numbness, No Cough - SOB.  No problems updated.  ALLERGIES: No Known Allergies  PAST MEDICAL HISTORY: Past Medical History:  Diagnosis Date   Abscess    Upper right chest   COVID 2020   asymptomatic   History of kidney stones    PCOS (polycystic ovarian syndrome)    Renal calculi    bilateral   Wears contact lenses     MEDICATIONS AT HOME: Prior to Admission medications   Not on File    Objective:   Vitals:   08/31/21 1438  BP: 101/67  Pulse: (!) 57  Temp: 97.7 F (36.5 C)  TempSrc: Temporal  SpO2: 95%  Weight: 282 lb 12.8 oz (128.3 kg)  Height: 5\' 7"  (1.702 m)   Exam General appearance : Awake, alert, not in any distress. Morbid obese female, Speech Clear.  HEENT: Atraumatic and Normocephalic, pupils equally reactive to light and accomodation Neck: Supple, no JVD. No cervical lymphadenopathy.  Chest: Good air entry bilaterally, no added sounds  CVS: S1 S2 regular, no murmurs.  Abdomen: Bowel sounds present, Non tender and not distended with no gaurding, rigidity or rebound. Extremities: B/L Lower Ext shows no edema, both legs are warm to touch Neurology: Awake alert, and oriented X 3,  Skin: No Rash  Data Review Lab Results  Component Value Date   HGBA1C 5.5 02/10/2021    Assessment & Plan   Mindy Davies was seen today for follow-up.  Diagnoses and all orders for this visit:  Encounter for weight management Morbid Obesity is > 40 BMI  indicating an excess in caloric intake or underlining conditions. This may lead to other co-morbidities. Lifestyle modifications of diet and exercise may reduce obesity.     Discussed increasing small frequent meals and exercising 30 mins daily or 150 mins weekly. Discussed risk factors associated with obesity.      Patient have been counseled extensively about nutrition and exercise. Other issues discussed during this visit include: low cholesterol diet, weight control and daily exercise, foot care, annual eye examinations at Ophthalmology, importance of adherence with medications and regular follow-up. We also discussed long term complications of uncontrolled diabetes and hypertension.   No follow-ups on file.   This note has been created with 02/12/2021. Any transcriptional errors are unintentional.    Education officer, environmental, 08/31/2021, 3:18 PM

## 2021-09-01 ENCOUNTER — Encounter (INDEPENDENT_AMBULATORY_CARE_PROVIDER_SITE_OTHER): Payer: 59 | Admitting: Primary Care

## 2021-09-01 LAB — CBC WITH DIFFERENTIAL/PLATELET
Basophils Absolute: 0 10*3/uL (ref 0.0–0.2)
Basos: 0 %
EOS (ABSOLUTE): 0.2 10*3/uL (ref 0.0–0.4)
Eos: 3 %
Hematocrit: 41.8 % (ref 34.0–46.6)
Hemoglobin: 14.5 g/dL (ref 11.1–15.9)
Immature Grans (Abs): 0 10*3/uL (ref 0.0–0.1)
Immature Granulocytes: 0 %
Lymphocytes Absolute: 1.7 10*3/uL (ref 0.7–3.1)
Lymphs: 27 %
MCH: 31.8 pg (ref 26.6–33.0)
MCHC: 34.7 g/dL (ref 31.5–35.7)
MCV: 92 fL (ref 79–97)
Monocytes Absolute: 0.3 10*3/uL (ref 0.1–0.9)
Monocytes: 4 %
Neutrophils Absolute: 4.2 10*3/uL (ref 1.4–7.0)
Neutrophils: 66 %
Platelets: 216 10*3/uL (ref 150–450)
RBC: 4.56 x10E6/uL (ref 3.77–5.28)
RDW: 12.7 % (ref 11.7–15.4)
WBC: 6.4 10*3/uL (ref 3.4–10.8)

## 2021-09-01 LAB — CMP14+EGFR
ALT: 14 IU/L (ref 0–32)
AST: 19 IU/L (ref 0–40)
Albumin/Globulin Ratio: 1.3 (ref 1.2–2.2)
Albumin: 4.3 g/dL (ref 3.8–4.8)
Alkaline Phosphatase: 178 IU/L — ABNORMAL HIGH (ref 44–121)
BUN/Creatinine Ratio: 7 — ABNORMAL LOW (ref 9–23)
BUN: 6 mg/dL (ref 6–20)
Bilirubin Total: 0.5 mg/dL (ref 0.0–1.2)
CO2: 27 mmol/L (ref 20–29)
Calcium: 9.7 mg/dL (ref 8.7–10.2)
Chloride: 99 mmol/L (ref 96–106)
Creatinine, Ser: 0.86 mg/dL (ref 0.57–1.00)
Globulin, Total: 3.4 g/dL (ref 1.5–4.5)
Glucose: 92 mg/dL (ref 70–99)
Potassium: 4.6 mmol/L (ref 3.5–5.2)
Sodium: 139 mmol/L (ref 134–144)
Total Protein: 7.7 g/dL (ref 6.0–8.5)
eGFR: 91 mL/min/{1.73_m2} (ref >59)

## 2021-09-03 ENCOUNTER — Encounter (INDEPENDENT_AMBULATORY_CARE_PROVIDER_SITE_OTHER): Payer: 59 | Admitting: Primary Care

## 2021-12-22 IMAGING — US US RENAL
2 series · 14 of 25 positions shown · non-contrast
Comparison: 04/26/2018, CT 02/09/2021

CLINICAL DATA: Acute kidney injury

EXAM:
RENAL / URINARY TRACT ULTRASOUND COMPLETE

[Series 1: us renal · 13 of 44 slices shown (1 of 2)]
[im 1/44]
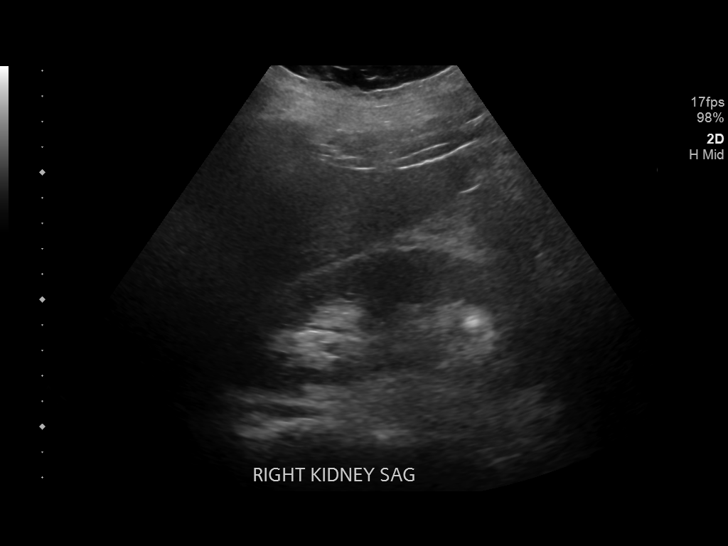
[im 4/44]
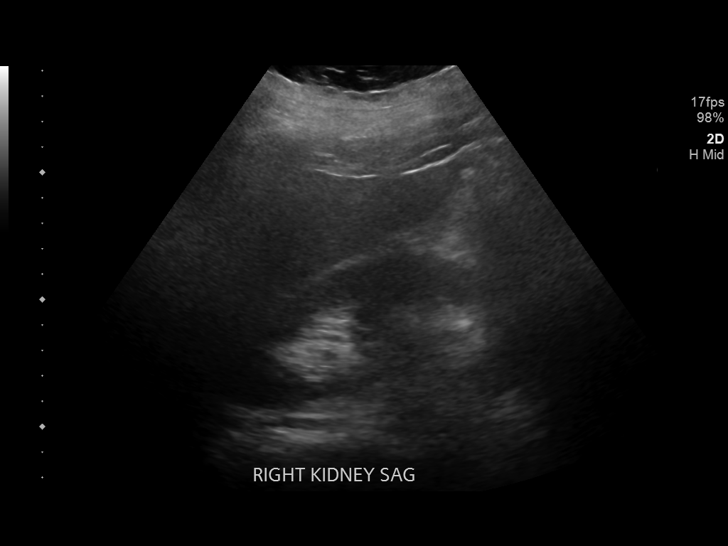
[im 8/44]
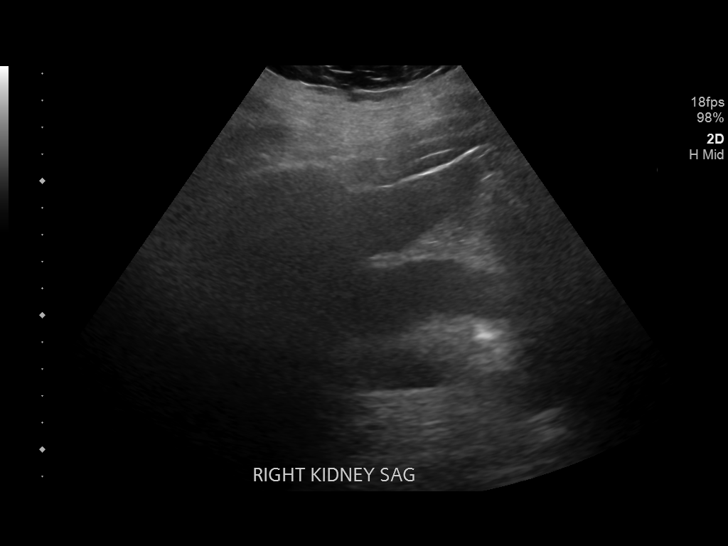
[im 12/44]
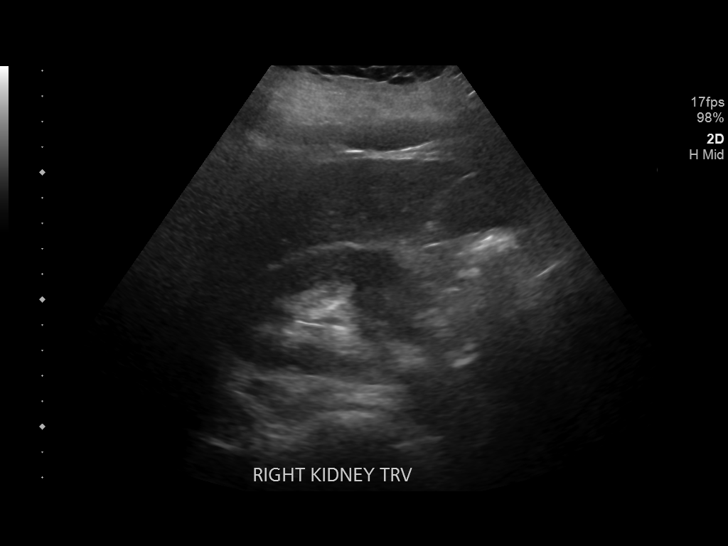
[im 15/44]
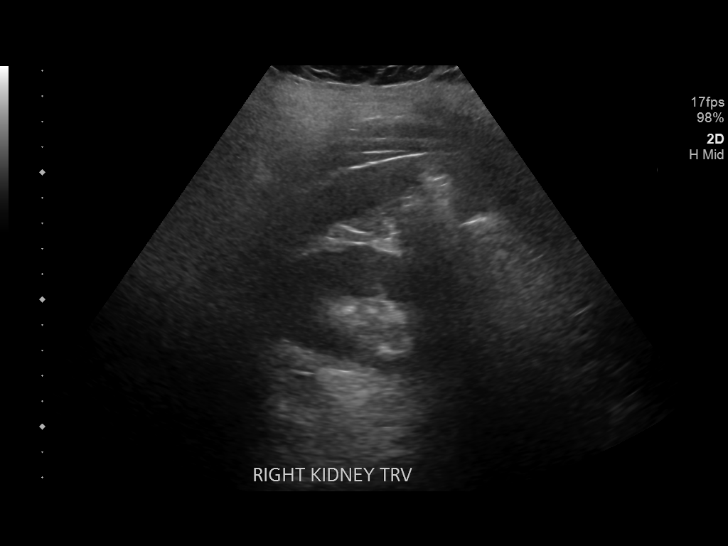
[im 17/44]
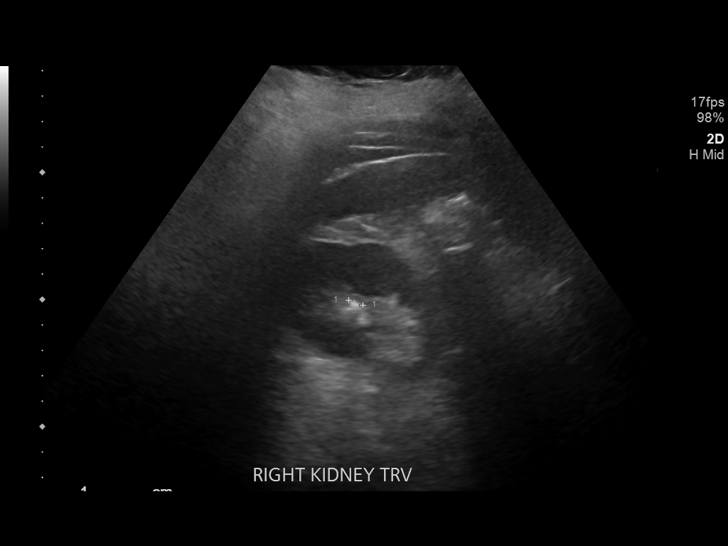
[im 21/44]
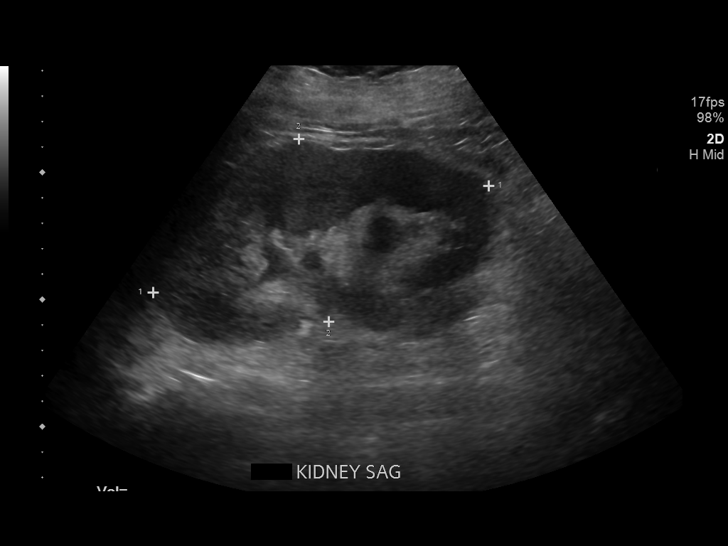
[im 25/44]
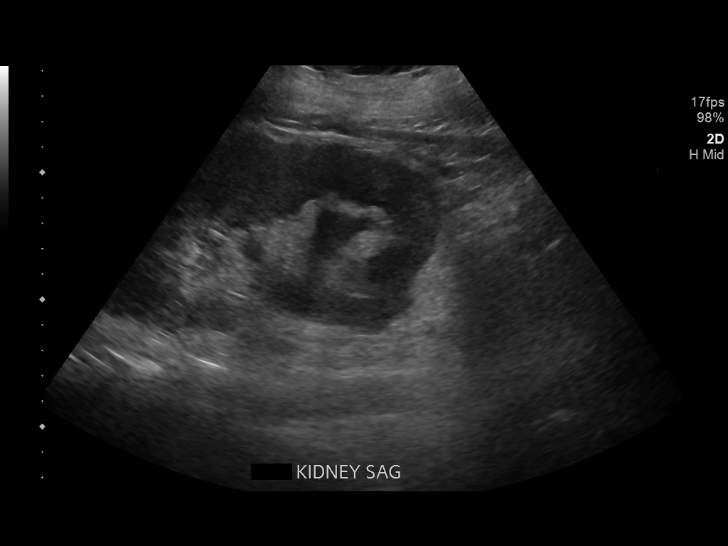
[im 29/44]
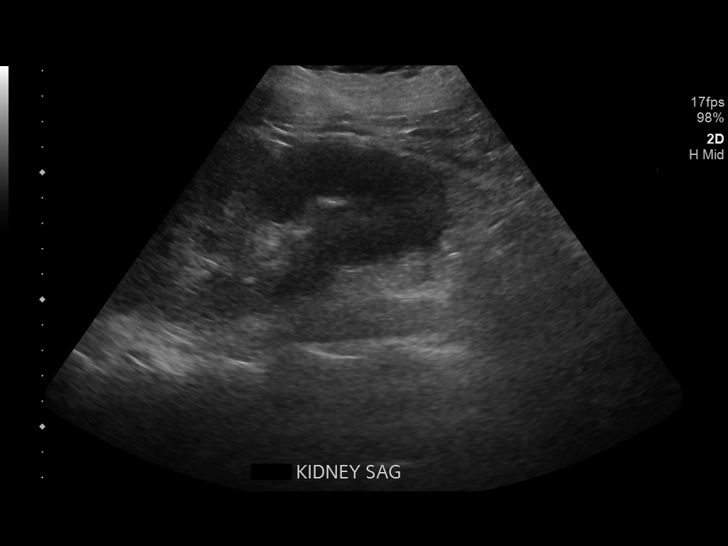
[im 30/44]
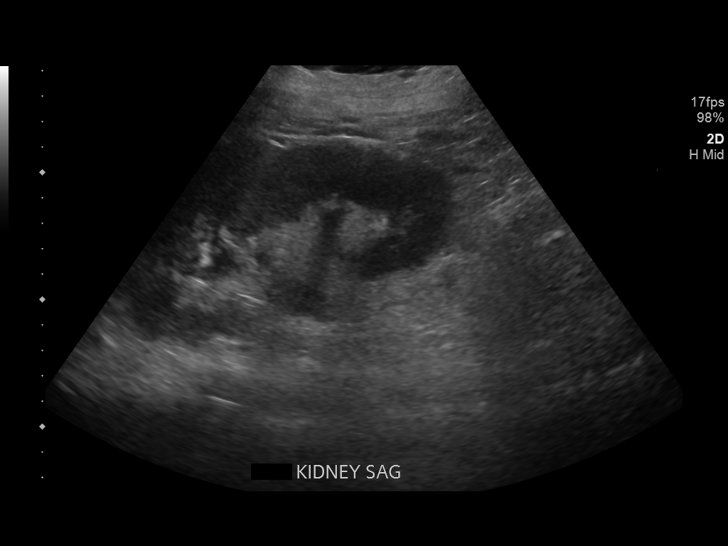
[im 34/44]
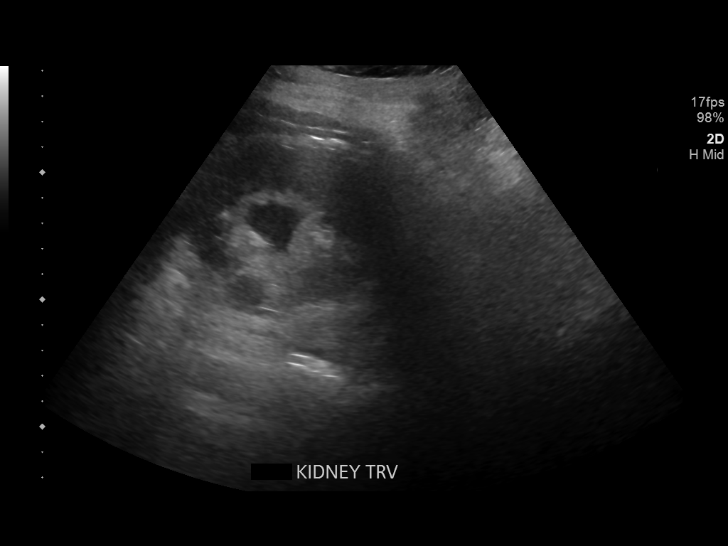
[im 38/44]
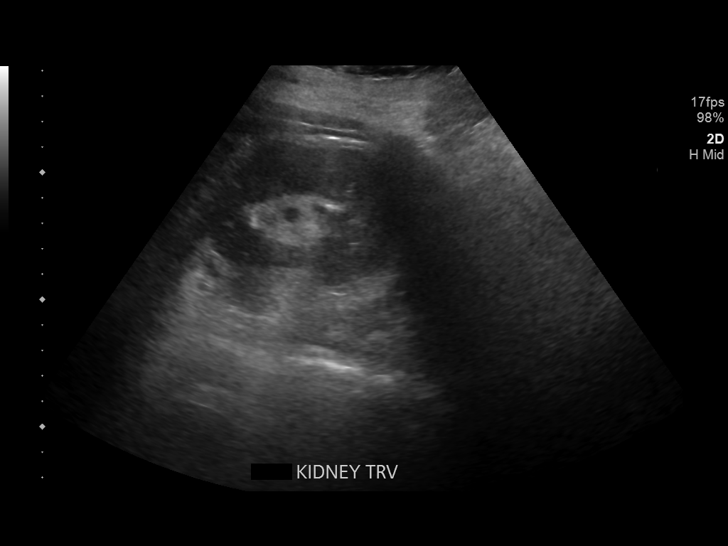
[im 42/44]
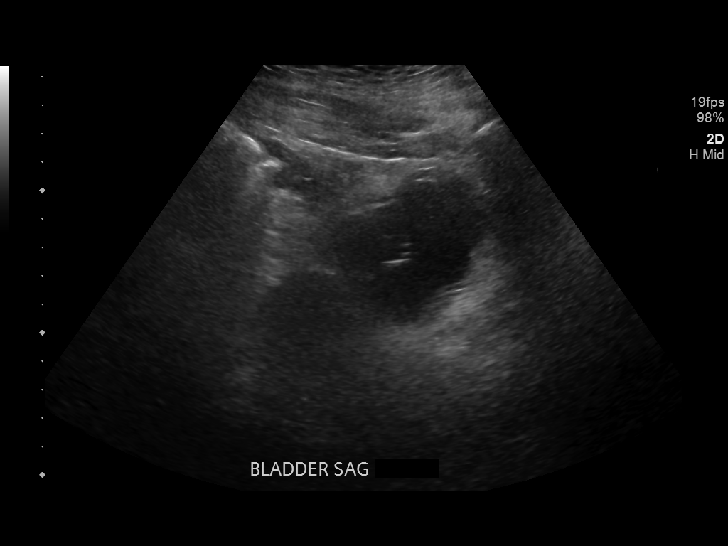

[Series 2: us renal · 1 of 2 slices shown (2 of 2)]
[im 1/2]
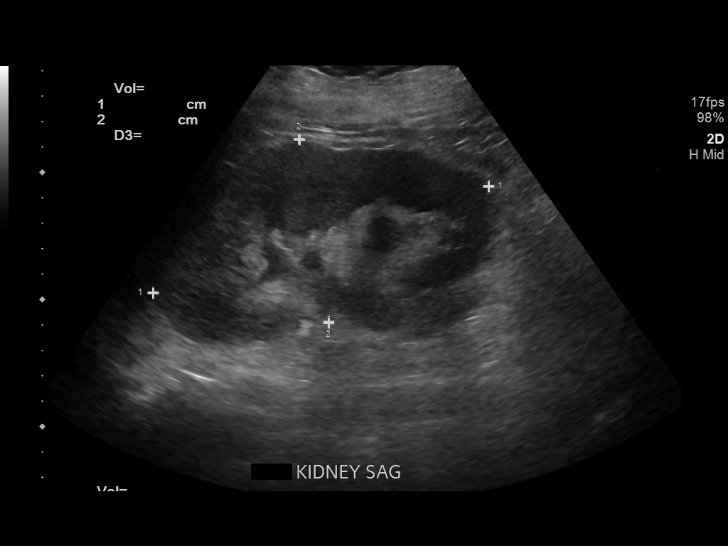

[14 of 25 positions shown; findings below may reference images not displayed]

FINDINGS: Right Kidney:

Renal measurements: 13.7 x 5.1 x 5.5 cm = volume: 199.9 mL. Cortical
echogenicity is within normal limits. No mass or hydronephrosis.
Shadowing stone at the lower pole measuring 8 mm.

Left Kidney:

Renal measurements: 13.9 x 7.3 x 8.5 cm = volume: 451.6 mL.
Echogenicity within normal limits. Mild to moderate left
hydronephrosis and proximal hydroureter. 4 mm shadowing stone within
the upper pole.

Bladder:

Foley catheter within the bladder

Other:

None.
IMPRESSION: 1. Similar mild to moderate left hydronephrosis and proximal
hydroureter. No right hydronephrosis.
2. Bilateral kidney stones
